# Patient Record
Sex: Female | Born: 1970 | Race: White | Hispanic: No | Marital: Married | Smoking: Heavy tobacco smoker
Health system: Southern US, Community
[De-identification: ages and names within clinical notes are randomized; demographics above are authoritative.]

## PROBLEM LIST (undated history)

## (undated) DIAGNOSIS — E785 Hyperlipidemia, unspecified: Secondary | ICD-10-CM

## (undated) DIAGNOSIS — R51 Headache: Secondary | ICD-10-CM

## (undated) DIAGNOSIS — F419 Anxiety disorder, unspecified: Secondary | ICD-10-CM

## (undated) DIAGNOSIS — E063 Autoimmune thyroiditis: Secondary | ICD-10-CM

## (undated) DIAGNOSIS — J40 Bronchitis, not specified as acute or chronic: Secondary | ICD-10-CM

## (undated) DIAGNOSIS — T7840XA Allergy, unspecified, initial encounter: Secondary | ICD-10-CM

## (undated) DIAGNOSIS — G4730 Sleep apnea, unspecified: Secondary | ICD-10-CM

## (undated) DIAGNOSIS — M1990 Unspecified osteoarthritis, unspecified site: Secondary | ICD-10-CM

## (undated) DIAGNOSIS — R519 Headache, unspecified: Secondary | ICD-10-CM

## (undated) DIAGNOSIS — E079 Disorder of thyroid, unspecified: Secondary | ICD-10-CM

## (undated) DIAGNOSIS — K219 Gastro-esophageal reflux disease without esophagitis: Secondary | ICD-10-CM

## (undated) HISTORY — DX: Headache: R51

## (undated) HISTORY — DX: Allergy, unspecified, initial encounter: T78.40XA

## (undated) HISTORY — DX: Autoimmune thyroiditis: E06.3

## (undated) HISTORY — PX: TONSILLECTOMY: SUR1361

## (undated) HISTORY — PX: ROTATOR CUFF REPAIR: SHX139

## (undated) HISTORY — DX: Anxiety disorder, unspecified: F41.9

## (undated) HISTORY — PX: KNEE ARTHROSCOPY: SHX127

## (undated) HISTORY — DX: Unspecified osteoarthritis, unspecified site: M19.90

## (undated) HISTORY — DX: Hyperlipidemia, unspecified: E78.5

## (undated) HISTORY — PX: ABDOMINAL HYSTERECTOMY: SHX81

## (undated) HISTORY — PX: CHOLECYSTECTOMY: SHX55

## (undated) HISTORY — DX: Bronchitis, not specified as acute or chronic: J40

## (undated) HISTORY — PX: TUBAL LIGATION: SHX77

## (undated) HISTORY — DX: Gastro-esophageal reflux disease without esophagitis: K21.9

## (undated) HISTORY — DX: Disorder of thyroid, unspecified: E07.9

## (undated) HISTORY — DX: Sleep apnea, unspecified: G47.30

## (undated) HISTORY — DX: Headache, unspecified: R51.9

---

## 2005-02-08 ENCOUNTER — Ambulatory Visit: Payer: Self-pay | Admitting: Family Medicine

## 2005-07-31 ENCOUNTER — Ambulatory Visit: Payer: Self-pay | Admitting: Family Medicine

## 2005-08-20 ENCOUNTER — Ambulatory Visit: Payer: Self-pay | Admitting: Orthopaedic Surgery

## 2005-11-27 ENCOUNTER — Ambulatory Visit: Payer: Self-pay | Admitting: Otolaryngology

## 2005-12-07 ENCOUNTER — Ambulatory Visit: Payer: Self-pay | Admitting: Otolaryngology

## 2005-12-17 ENCOUNTER — Ambulatory Visit: Payer: Self-pay | Admitting: Otolaryngology

## 2008-05-18 ENCOUNTER — Ambulatory Visit: Payer: Self-pay | Admitting: Gastroenterology

## 2008-07-09 ENCOUNTER — Ambulatory Visit: Payer: Self-pay | Admitting: Gastroenterology

## 2008-08-06 ENCOUNTER — Ambulatory Visit: Payer: Self-pay | Admitting: Obstetrics and Gynecology

## 2008-08-09 ENCOUNTER — Ambulatory Visit: Payer: Self-pay | Admitting: Obstetrics and Gynecology

## 2008-10-07 ENCOUNTER — Ambulatory Visit: Payer: Self-pay | Admitting: Surgery

## 2009-05-10 ENCOUNTER — Ambulatory Visit: Payer: Self-pay | Admitting: Internal Medicine

## 2009-08-16 ENCOUNTER — Ambulatory Visit: Payer: Self-pay | Admitting: Internal Medicine

## 2009-09-13 ENCOUNTER — Ambulatory Visit: Payer: Self-pay | Admitting: Family Medicine

## 2009-11-04 ENCOUNTER — Ambulatory Visit: Payer: Self-pay | Admitting: Unknown Physician Specialty

## 2011-03-05 ENCOUNTER — Ambulatory Visit: Payer: Self-pay

## 2011-10-22 ENCOUNTER — Ambulatory Visit: Payer: Self-pay | Admitting: Family Medicine

## 2012-08-22 ENCOUNTER — Ambulatory Visit: Payer: Self-pay | Admitting: Family Medicine

## 2012-11-04 ENCOUNTER — Ambulatory Visit: Payer: Self-pay | Admitting: Family Medicine

## 2013-04-16 DIAGNOSIS — M069 Rheumatoid arthritis, unspecified: Secondary | ICD-10-CM | POA: Diagnosis not present

## 2013-05-25 DIAGNOSIS — L819 Disorder of pigmentation, unspecified: Secondary | ICD-10-CM | POA: Diagnosis not present

## 2013-05-25 DIAGNOSIS — L908 Other atrophic disorders of skin: Secondary | ICD-10-CM | POA: Diagnosis not present

## 2013-05-25 DIAGNOSIS — L918 Other hypertrophic disorders of the skin: Secondary | ICD-10-CM | POA: Diagnosis not present

## 2013-05-25 DIAGNOSIS — L719 Rosacea, unspecified: Secondary | ICD-10-CM | POA: Diagnosis not present

## 2013-05-25 DIAGNOSIS — D2330 Other benign neoplasm of skin of unspecified part of face: Secondary | ICD-10-CM | POA: Diagnosis not present

## 2013-07-08 DIAGNOSIS — K5900 Constipation, unspecified: Secondary | ICD-10-CM | POA: Diagnosis not present

## 2013-07-08 DIAGNOSIS — K219 Gastro-esophageal reflux disease without esophagitis: Secondary | ICD-10-CM | POA: Diagnosis not present

## 2013-07-08 DIAGNOSIS — E039 Hypothyroidism, unspecified: Secondary | ICD-10-CM | POA: Diagnosis not present

## 2013-07-08 DIAGNOSIS — J3089 Other allergic rhinitis: Secondary | ICD-10-CM | POA: Diagnosis not present

## 2014-02-24 ENCOUNTER — Ambulatory Visit: Payer: Self-pay | Admitting: Physician Assistant

## 2014-02-24 DIAGNOSIS — K219 Gastro-esophageal reflux disease without esophagitis: Secondary | ICD-10-CM | POA: Diagnosis not present

## 2014-02-24 DIAGNOSIS — E039 Hypothyroidism, unspecified: Secondary | ICD-10-CM | POA: Diagnosis not present

## 2014-02-24 DIAGNOSIS — F17210 Nicotine dependence, cigarettes, uncomplicated: Secondary | ICD-10-CM | POA: Diagnosis not present

## 2014-02-24 DIAGNOSIS — H6002 Abscess of left external ear: Secondary | ICD-10-CM | POA: Diagnosis not present

## 2014-03-03 LAB — WOUND CULTURE

## 2014-04-26 DIAGNOSIS — M25511 Pain in right shoulder: Secondary | ICD-10-CM | POA: Diagnosis not present

## 2014-04-26 DIAGNOSIS — M25512 Pain in left shoulder: Secondary | ICD-10-CM | POA: Diagnosis not present

## 2014-04-26 DIAGNOSIS — M0579 Rheumatoid arthritis with rheumatoid factor of multiple sites without organ or systems involvement: Secondary | ICD-10-CM | POA: Diagnosis not present

## 2014-05-20 DIAGNOSIS — E039 Hypothyroidism, unspecified: Secondary | ICD-10-CM | POA: Diagnosis not present

## 2014-05-20 DIAGNOSIS — R194 Change in bowel habit: Secondary | ICD-10-CM | POA: Diagnosis not present

## 2014-07-29 DIAGNOSIS — K5909 Other constipation: Secondary | ICD-10-CM | POA: Diagnosis not present

## 2014-07-29 DIAGNOSIS — K625 Hemorrhage of anus and rectum: Secondary | ICD-10-CM | POA: Diagnosis not present

## 2014-08-20 ENCOUNTER — Encounter: Payer: Self-pay | Admitting: *Deleted

## 2014-08-20 ENCOUNTER — Ambulatory Visit: Payer: BLUE CROSS/BLUE SHIELD | Admitting: Anesthesiology

## 2014-08-20 ENCOUNTER — Encounter
Admission: RE | Disposition: A | Discharge status: Home / Self Care | Payer: Self-pay | Source: Ambulatory Visit | Attending: Gastroenterology

## 2014-08-20 ENCOUNTER — Ambulatory Visit
Admission: RE | Admit: 2014-08-20 | Discharge: 2014-08-20 | Disposition: A | Discharge status: Home / Self Care | Payer: BLUE CROSS/BLUE SHIELD | Source: Ambulatory Visit | Attending: Gastroenterology | Admitting: Gastroenterology

## 2014-08-20 DIAGNOSIS — Z9889 Other specified postprocedural states: Secondary | ICD-10-CM | POA: Diagnosis not present

## 2014-08-20 DIAGNOSIS — Z9049 Acquired absence of other specified parts of digestive tract: Secondary | ICD-10-CM | POA: Insufficient documentation

## 2014-08-20 DIAGNOSIS — Z791 Long term (current) use of non-steroidal anti-inflammatories (NSAID): Secondary | ICD-10-CM | POA: Insufficient documentation

## 2014-08-20 DIAGNOSIS — K640 First degree hemorrhoids: Secondary | ICD-10-CM | POA: Diagnosis not present

## 2014-08-20 DIAGNOSIS — K648 Other hemorrhoids: Secondary | ICD-10-CM | POA: Diagnosis not present

## 2014-08-20 DIAGNOSIS — K625 Hemorrhage of anus and rectum: Secondary | ICD-10-CM | POA: Diagnosis not present

## 2014-08-20 DIAGNOSIS — Z79899 Other long term (current) drug therapy: Secondary | ICD-10-CM | POA: Insufficient documentation

## 2014-08-20 HISTORY — PX: COLONOSCOPY: SHX5424

## 2014-08-20 SURGERY — COLONOSCOPY
Anesthesia: General

## 2014-08-20 MED ORDER — SODIUM CHLORIDE 0.9 % IV SOLN
INTRAVENOUS | Status: DC
  Administered 2014-08-20: 1000 mL via INTRAVENOUS

## 2014-08-20 MED ORDER — LIDOCAINE HCL (CARDIAC) 20 MG/ML IV SOLN
INTRAVENOUS | Status: DC | PRN
  Administered 2014-08-20: 60 mg via INTRAVENOUS

## 2014-08-20 MED ORDER — MIDAZOLAM HCL 2 MG/2ML IJ SOLN
INTRAMUSCULAR | Status: DC | PRN
  Administered 2014-08-20: 1 mg via INTRAVENOUS

## 2014-08-20 MED ORDER — PROPOFOL INFUSION 10 MG/ML OPTIME
INTRAVENOUS | Status: DC | PRN
  Administered 2014-08-20: 100 ug/kg/min via INTRAVENOUS

## 2014-08-20 MED ORDER — FENTANYL CITRATE (PF) 100 MCG/2ML IJ SOLN
INTRAMUSCULAR | Status: DC | PRN
  Administered 2014-08-20: 50 ug via INTRAVENOUS

## 2014-08-20 NOTE — H&P (Signed)
  Primary Care Physician:  [place] Pink, MD  Pre-Procedure History & Physical: HPI:  Lisa Wilkins is a 44 y.o. female is here for an colonoscopy.   No past medical history on file.  Past Surgical History  Procedure Laterality Date  . Cholecystectomy    . Tonsillectomy    . Knee arthroscopy Left     Prior to Admission medications   Medication Sig Start Date End Date Taking? Authorizing Provider  acetaminophen (TYLENOL) 650 MG CR tablet Take 1,300 mg by mouth every 8 (eight) hours as needed for pain.   Yes Historical Provider, MD  celecoxib (CELEBREX) 200 MG capsule Take 200 mg by mouth daily as needed for moderate pain.   Yes Historical Provider, MD  esomeprazole (NEXIUM) 20 MG capsule Take 40 mg by mouth daily at 12 noon.   Yes Historical Provider, MD  etanercept (ENBREL) 50 MG/ML injection Inject 50 mg into the skin once a week. On Saturday   Yes Historical Provider, MD  leflunomide (ARAVA) 20 MG tablet Take 20 mg by mouth daily.   Yes Historical Provider, MD  levothyroxine (SYNTHROID, LEVOTHROID) 150 MCG tablet Take 150 mcg by mouth daily before breakfast.   Yes Historical Provider, MD    Allergies as of 08/06/2014  . (Not on File)    No family history on file.  History   Social History  . Marital Status: Married    Spouse Name: N/A  . Number of Children: N/A  . Years of Education: N/A   Occupational History  . Not on file.   Social History Main Topics  . Smoking status: Not on file  . Smokeless tobacco: Not on file  . Alcohol Use: Not on file  . Drug Use: Not on file  . Sexual Activity: Not on file   Other Topics Concern  . Not on file   Social History Narrative  . No narrative on file     Physical Exam: BP 107/77 mmHg  Pulse 65  Temp(Src) 98.2 F (36.8 C) (Tympanic)  Resp 18  Ht 5\' 2"  (1.575 m)  Wt 153 lb (69.4 kg)  BMI 27.98 kg/m2  SpO2 99% General:   Alert,  pleasant and cooperative in NAD Head:  Normocephalic and atraumatic. Neck:   Supple; no masses or thyromegaly. Lungs:  Clear throughout to auscultation.    Heart:  Regular rate and rhythm. Abdomen:  Soft, nontender and nondistended. Normal bowel sounds, without guarding, and without rebound.   Neurologic:  Alert and  oriented x4;  grossly normal neurologically.  Impression/Plan: Lisa Wilkins is here for an colonoscopy to be performed for rectal bleeding.   Risks, benefits, limitations, and alternatives regarding  colonoscopy have been reviewed with the patient.  Questions have been answered.  All parties agreeable.   Josefine Class, MD  08/20/2014, 12:37 PM

## 2014-08-20 NOTE — Op Note (Signed)
[facility] Patient Name: Lisa Wilkins Procedure Date: 08/20/2014 1:22 PM MRN: [id number] Account #: [id number] Date of Birth: 10/12/1970 Admit Type: Outpatient Age: 44 Room: [facility] ROOM 1 Gender: Female Note Status: Finalized Procedure:         Colonoscopy Indications:       Last colonoscopy: 2010, Rectal bleeding Patient Profile:   This is a 44 year old female. Providers:         Gerrit Heck. Rayann Heman, MD Referring MD:      Irven Easterly. Kary Kos, MD (Referring MD) Medicines:         Propofol per Anesthesia Complications:     No immediate complications. Procedure:         Pre-Anesthesia Assessment:                    - Prior to the procedure, a History and Physical was                     performed, and patient medications and allergies were                     reviewed. The patient is competent. The risks and benefits                     of the procedure and the sedation options and risks were                     discussed with the patient. All questions were answered                     and informed consent was obtained. Patient identification                     and proposed procedure were verified by the physician and                     the nurse in the pre-procedure area. Mental Status                     Examination: alert and oriented. Airway Examination:                     normal oropharyngeal airway and neck mobility. Respiratory                     Examination: clear to auscultation. CV Examination: RRR,                     no murmurs, no S3 or S4. Prophylactic Antibiotics: The                     patient does not require prophylactic antibiotics. Prior                     Anticoagulants: The patient has taken no previous                     anticoagulant or antiplatelet agents. ASA Grade                     Assessment: II - A patient with mild systemic disease.                     After reviewing the risks and benefits, the  patient was                    deemed in satisfactory condition to undergo the procedure.                     The anesthesia plan was to use monitored anesthesia care                     (MAC). Immediately prior to administration of medications,                     the patient was re-assessed for adequacy to receive                     sedatives. The heart rate, respiratory rate, oxygen                     saturations, blood pressure, adequacy of pulmonary                     ventilation, and response to care were monitored                     throughout the procedure. The physical status of the                     patient was re-assessed after the procedure.                    - Prior to the procedure, a History and Physical was                     performed, and patient medications, allergies and                     sensitivities were reviewed. The patient's tolerance of                     previous anesthesia was reviewed.                    After obtaining informed consent, the colonoscope was                     passed under direct vision. Throughout the procedure, the                     patient's blood pressure, pulse, and oxygen saturations                     were monitored continuously. The Colonoscope was                     introduced through the anus and advanced to the the                     terminal ileum. The colonoscopy was performed without                     difficulty. The patient tolerated the procedure well. The                     quality of the bowel preparation was good. Findings:      The perianal and digital rectal examinations were normal.      Internal hemorrhoids were found during retroflexion. The hemorrhoids  were Grade I (internal hemorrhoids that do not prolapse).      The terminal ileum appeared normal.      The exam was otherwise without abnormality. Impression:        - Internal hemorrhoids.                    - The examined portion of the ileum was normal.                     - The examination was otherwise normal.                    - No specimens collected.                    - Likely source of bleeding is from small int hemorhoids. Recommendation:    - Observe patient in GI recovery unit.                    - High fiber diet.                    - Continue present medications.                    - Repeat colonoscopy in 10 years for screening purposes.                    - Return to referring physician.                    - The findings and recommendations were discussed with the                     patient.                    - The findings and recommendations were discussed with the                     patient's family. Procedure Code(s): --- Professional ---                    [phone], Colonoscopy, flexible; diagnostic, including                     collection of specimen(s) by brushing or washing, when                     performed (separate procedure) CPT copyright 2014 American [employer]. All rights reserved. The codes documented in this report are preliminary and upon coder review may  be revised to meet current compliance requirements. Mellody Life, MD 08/20/2014 2:03:57 PM This report has been signed electronically. Number of Addenda: 0 Note Initiated On: 08/20/2014 1:22 PM Scope Withdrawal Time: 0 hours 8 minutes 41 seconds  Total Procedure Duration: 0 hours 11 minutes 48 seconds       [facility]

## 2014-08-20 NOTE — Anesthesia Preprocedure Evaluation (Signed)
Anesthesia Evaluation  Patient identified by MRN, date of birth, ID band Patient awake    Reviewed: Allergy & Precautions, H&P , NPO status , Patient's Chart, lab work & pertinent test results, reviewed documented beta blocker date and time   Airway Mallampati: II  TM Distance: >3 FB Neck ROM: full    Dental no notable dental hx.    Pulmonary neg pulmonary ROS, asthma ,  breath sounds clear to auscultation  Pulmonary exam normal       Cardiovascular Exercise Tolerance: Good negative cardio ROS  Rhythm:regular Rate:Normal     Neuro/Psych negative neurological ROS  negative psych ROS   GI/Hepatic negative GI ROS, Neg liver ROS,   Endo/Other  negative endocrine ROS  Renal/GU negative Renal ROS  negative genitourinary   Musculoskeletal   Abdominal   Peds  Hematology negative hematology ROS (+)   Anesthesia Other Findings   Reproductive/Obstetrics negative OB ROS                             Anesthesia Physical Anesthesia Plan  ASA: II  Anesthesia Plan: General   Post-op Pain Management:    Induction:   Airway Management Planned:   Additional Equipment:   Intra-op Plan:   Post-operative Plan:   Informed Consent: I have reviewed the patients History and Physical, chart, labs and discussed the procedure including the risks, benefits and alternatives for the proposed anesthesia with the patient or authorized representative who has indicated his/her understanding and acceptance.   Dental Advisory Given  Plan Discussed with: CRNA  Anesthesia Plan Comments:         Anesthesia Quick Evaluation

## 2014-08-20 NOTE — Transfer of Care (Signed)
Immediate Anesthesia Transfer of Care Note  Patient: Lisa Wilkins  Procedure(s) Performed: Procedure(s): COLONOSCOPY (N/A)  Patient Location: PACU  Anesthesia Type:General  Level of Consciousness: awake, alert  and oriented  Airway & Oxygen Therapy: Patient Spontanous Breathing and Patient connected to nasal cannula oxygen  Post-op Assessment: Report given to RN and Post -op Vital signs reviewed and stable  Post vital signs: Reviewed and stable  Last Vitals:  Filed Vitals:   08/20/14 1405  BP: 93/62  Pulse: 77  Temp: 36 C  Resp: 17    Complications: No apparent anesthesia complications

## 2014-08-21 NOTE — Anesthesia Postprocedure Evaluation (Deleted)
  Anesthesia Post-op Note  Patient: Lisa Wilkins  Procedure(s) Performed: Procedure(s): COLONOSCOPY (N/A)  Anesthesia type:General  Patient location: PACU  Post pain: Pain level controlled  Post assessment: Post-op Vital signs reviewed, Patient's Cardiovascular Status Stable, Respiratory Function Stable, Patent Airway and No signs of Nausea or vomiting  Post vital signs: Reviewed and stable  Last Vitals:  Filed Vitals:   08/20/14 1430  BP: 107/67  Pulse: 55  Temp:   Resp: 14    Level of consciousness: awake, alert  and patient cooperative  Complications: No apparent anesthesia complications

## 2014-08-23 NOTE — Anesthesia Postprocedure Evaluation (Signed)
  Anesthesia Post-op Note  Patient: Lisa Wilkins  Procedure(s) Performed: Procedure(s): COLONOSCOPY (N/A)  Anesthesia type:General  Patient location: PACU  Post pain: Pain level controlled  Post assessment: Post-op Vital signs reviewed, Patient's Cardiovascular Status Stable, Respiratory Function Stable, Patent Airway and No signs of Nausea or vomiting  Post vital signs: Reviewed and stable  Last Vitals:  Filed Vitals:   08/20/14 1430  BP: 107/67  Pulse: 55  Temp:   Resp: 14    Level of consciousness: awake, alert  and patient cooperative  Complications: No apparent anesthesia complications

## 2014-08-24 ENCOUNTER — Encounter: Payer: Self-pay | Admitting: Gastroenterology

## 2014-09-09 ENCOUNTER — Encounter: Payer: Self-pay | Admitting: Physical Therapy

## 2014-09-09 ENCOUNTER — Ambulatory Visit: Payer: BLUE CROSS/BLUE SHIELD | Attending: Gastroenterology | Admitting: Physical Therapy

## 2014-09-09 DIAGNOSIS — R279 Unspecified lack of coordination: Secondary | ICD-10-CM | POA: Diagnosis not present

## 2014-09-09 DIAGNOSIS — K5900 Constipation, unspecified: Secondary | ICD-10-CM | POA: Diagnosis not present

## 2014-09-09 DIAGNOSIS — M629 Disorder of muscle, unspecified: Secondary | ICD-10-CM
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
## 2014-09-09 NOTE — Patient Instructions (Addendum)
      2) try coffee alternative 50% and coffee 50% to slowly lean off of coffee. [employer] has ingredients for increased fiber 3) info on nutritionist at [facility]          4)                                        Preserve the function of your pelvic floor, abdomen, and back.              Avoid decreased straining of abdominal/pelvic floor muscles with less              slouching,  holding your breath with lifting/bowel movements)                                                     FUNCTIONAL POSTURES

## 2014-09-15 ENCOUNTER — Ambulatory Visit: Payer: BLUE CROSS/BLUE SHIELD | Admitting: Physical Therapy

## 2014-09-15 DIAGNOSIS — M629 Disorder of muscle, unspecified: Secondary | ICD-10-CM

## 2014-09-15 DIAGNOSIS — R279 Unspecified lack of coordination: Secondary | ICD-10-CM

## 2014-09-15 DIAGNOSIS — K5900 Constipation, unspecified: Secondary | ICD-10-CM | POA: Diagnosis not present

## 2014-09-15 NOTE — Patient Instructions (Addendum)
PELVIC FLOOR / KEGEL EXERCISES   Pelvic floor/ Kegel exercises are used to strengthen the muscles in the base of your pelvis that are responsible for supporting your pelvic organs and preventing urine/feces leakage. Based on your therapist's recommendations, they can be performed while standing, sitting, or lying down. Imagine pelvic floor area as a diamond with pelvic landmarks: top =pubic bone, bottom tip=tailbone, sides=sitting bones (ischial tuberosities).    Make yourself aware of this muscle group by using these cues while coordinating your breath:  Inhale, feel pelvic floor diamond area lower like hammock towards your feet and ribcage/belly expanding. Pause. Let the exhale naturally and feel your belly sink, abdominal muscles hugging in around you and you may notice the pelvic diamond draws upward towards your head forming a umbrella shape. Give a squeeze during the exhalation like you are stopping the flow of urine. If you are squeezing the buttock muscles, try to give 50% less effort.   Common Errors:  Breath holding: If you are holding your breath, you may be bearing down against your bladder instead of pulling it up. If you belly bulges up while you are squeezing, you are holding your breath. Be sure to breathe gently in and out while exercising. Counting out loud may help you avoid holding your breath.  Accessory muscle use: You should not see or feel other muscle movement when performing pelvic floor exercises. When done properly, no one can tell that you are performing the exercises. Keep the buttocks, belly and inner thighs relaxed.  Overdoing it: Your muscles can fatigue and stop working for you if you over-exercise. You may actually leak more or feel soreness at the lower abdomen or rectum.  YOUR HOME EXERCISE PROGRAM  LONG HOLDS: Position: on back with pillow under back   Inhale and then exhale. Then squeeze the muscle and count aloud for 10 seconds. Rest with three long  breaths. (Be sure to let belly sink in with exhales and not push outward)  Perform 6 repetitions, 3 times/day                      DECREASE DOWNWARD PRESSURE ON  YOUR PELVIC FLOOR, ABDOMINAL, LOW BACK MUSCLES       PRESERVE YOUR PELVIC HEALTH LONG-TERM   ** SQUEEZE pelvic floor BEFORE YOUR SNEEZE, COUGH, LAUGH   ** EXHALE BEFORE YOU RISE AGAINST GRAVITY (lifting, sit to stand, from squat to stand)   ** LOG ROLL OUT OF BED INSTEAD OF CRUNCH/SIT-UP     2) Dietary recommendations:  Add one more bottle of water in per day with bottle of water with crystal light  (flavor water with mint, lemon, strawberry)      Drink one cup of water and have non sugary yogurt before coffee    3) belly massage (handout)
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
## 2014-09-20 ENCOUNTER — Encounter: Payer: Medicare Other | Admitting: Physical Therapy

## 2014-09-27 ENCOUNTER — Encounter: Payer: Medicare Other | Admitting: Physical Therapy

## 2014-10-06 ENCOUNTER — Encounter: Payer: Medicare Other | Admitting: Physical Therapy

## 2015-01-12 DIAGNOSIS — M25512 Pain in left shoulder: Secondary | ICD-10-CM | POA: Diagnosis not present

## 2015-01-12 DIAGNOSIS — M0579 Rheumatoid arthritis with rheumatoid factor of multiple sites without organ or systems involvement: Secondary | ICD-10-CM | POA: Diagnosis not present

## 2015-01-12 DIAGNOSIS — E039 Hypothyroidism, unspecified: Secondary | ICD-10-CM | POA: Diagnosis not present

## 2015-01-12 DIAGNOSIS — G8929 Other chronic pain: Secondary | ICD-10-CM | POA: Diagnosis not present

## 2015-01-14 ENCOUNTER — Other Ambulatory Visit: Payer: Self-pay | Admitting: Rheumatology

## 2015-01-14 DIAGNOSIS — G8929 Other chronic pain: Secondary | ICD-10-CM

## 2015-01-14 DIAGNOSIS — M25512 Pain in left shoulder: Principal | ICD-10-CM

## 2015-01-24 ENCOUNTER — Ambulatory Visit
Admission: RE | Admit: 2015-01-24 | Discharge: 2015-01-24 | Disposition: A | Discharge status: Home / Self Care | Payer: BLUE CROSS/BLUE SHIELD | Source: Ambulatory Visit | Attending: Rheumatology | Admitting: Rheumatology

## 2015-01-24 DIAGNOSIS — G8929 Other chronic pain: Secondary | ICD-10-CM | POA: Diagnosis present

## 2015-01-24 DIAGNOSIS — M67912 Unspecified disorder of synovium and tendon, left shoulder: Secondary | ICD-10-CM | POA: Diagnosis not present

## 2015-01-24 DIAGNOSIS — R531 Weakness: Secondary | ICD-10-CM | POA: Diagnosis present

## 2015-01-24 DIAGNOSIS — M25512 Pain in left shoulder: Secondary | ICD-10-CM

## 2015-01-24 DIAGNOSIS — M75112 Incomplete rotator cuff tear or rupture of left shoulder, not specified as traumatic: Secondary | ICD-10-CM | POA: Insufficient documentation

## 2015-03-17 DIAGNOSIS — M75102 Unspecified rotator cuff tear or rupture of left shoulder, not specified as traumatic: Secondary | ICD-10-CM | POA: Diagnosis not present

## 2015-03-17 DIAGNOSIS — S46212A Strain of muscle, fascia and tendon of other parts of biceps, left arm, initial encounter: Secondary | ICD-10-CM | POA: Diagnosis not present

## 2015-03-17 DIAGNOSIS — M069 Rheumatoid arthritis, unspecified: Secondary | ICD-10-CM | POA: Diagnosis not present

## 2015-04-05 DIAGNOSIS — M25612 Stiffness of left shoulder, not elsewhere classified: Secondary | ICD-10-CM | POA: Diagnosis not present

## 2015-04-05 DIAGNOSIS — M25512 Pain in left shoulder: Secondary | ICD-10-CM | POA: Diagnosis not present

## 2015-04-08 DIAGNOSIS — M25612 Stiffness of left shoulder, not elsewhere classified: Secondary | ICD-10-CM | POA: Diagnosis not present

## 2015-04-08 DIAGNOSIS — M25512 Pain in left shoulder: Secondary | ICD-10-CM | POA: Diagnosis not present

## 2015-04-11 DIAGNOSIS — M25612 Stiffness of left shoulder, not elsewhere classified: Secondary | ICD-10-CM | POA: Diagnosis not present

## 2015-04-11 DIAGNOSIS — M25512 Pain in left shoulder: Secondary | ICD-10-CM | POA: Diagnosis not present

## 2015-04-14 DIAGNOSIS — M25512 Pain in left shoulder: Secondary | ICD-10-CM | POA: Diagnosis not present

## 2015-04-14 DIAGNOSIS — M25612 Stiffness of left shoulder, not elsewhere classified: Secondary | ICD-10-CM | POA: Diagnosis not present

## 2015-04-18 DIAGNOSIS — M25612 Stiffness of left shoulder, not elsewhere classified: Secondary | ICD-10-CM | POA: Diagnosis not present

## 2015-04-18 DIAGNOSIS — M25512 Pain in left shoulder: Secondary | ICD-10-CM | POA: Diagnosis not present

## 2015-04-20 DIAGNOSIS — M25612 Stiffness of left shoulder, not elsewhere classified: Secondary | ICD-10-CM | POA: Diagnosis not present

## 2015-04-20 DIAGNOSIS — M25512 Pain in left shoulder: Secondary | ICD-10-CM | POA: Diagnosis not present

## 2015-04-27 DIAGNOSIS — M25512 Pain in left shoulder: Secondary | ICD-10-CM | POA: Diagnosis not present

## 2015-04-27 DIAGNOSIS — M25612 Stiffness of left shoulder, not elsewhere classified: Secondary | ICD-10-CM | POA: Diagnosis not present

## 2015-05-04 DIAGNOSIS — M25512 Pain in left shoulder: Secondary | ICD-10-CM | POA: Diagnosis not present

## 2015-05-04 DIAGNOSIS — M25612 Stiffness of left shoulder, not elsewhere classified: Secondary | ICD-10-CM | POA: Diagnosis not present

## 2015-05-13 DIAGNOSIS — M25612 Stiffness of left shoulder, not elsewhere classified: Secondary | ICD-10-CM | POA: Diagnosis not present

## 2015-05-13 DIAGNOSIS — M25512 Pain in left shoulder: Secondary | ICD-10-CM | POA: Diagnosis not present

## 2015-05-16 DIAGNOSIS — M25612 Stiffness of left shoulder, not elsewhere classified: Secondary | ICD-10-CM | POA: Diagnosis not present

## 2015-05-16 DIAGNOSIS — M25512 Pain in left shoulder: Secondary | ICD-10-CM | POA: Diagnosis not present

## 2015-05-24 DIAGNOSIS — M25612 Stiffness of left shoulder, not elsewhere classified: Secondary | ICD-10-CM | POA: Diagnosis not present

## 2015-05-24 DIAGNOSIS — M25512 Pain in left shoulder: Secondary | ICD-10-CM | POA: Diagnosis not present

## 2015-05-30 DIAGNOSIS — M25612 Stiffness of left shoulder, not elsewhere classified: Secondary | ICD-10-CM | POA: Diagnosis not present

## 2015-05-30 DIAGNOSIS — M25512 Pain in left shoulder: Secondary | ICD-10-CM | POA: Diagnosis not present

## 2015-06-02 DIAGNOSIS — R0981 Nasal congestion: Secondary | ICD-10-CM | POA: Diagnosis not present

## 2015-06-02 DIAGNOSIS — J301 Allergic rhinitis due to pollen: Secondary | ICD-10-CM | POA: Diagnosis not present

## 2015-06-09 DIAGNOSIS — M25612 Stiffness of left shoulder, not elsewhere classified: Secondary | ICD-10-CM | POA: Diagnosis not present

## 2015-06-09 DIAGNOSIS — M25512 Pain in left shoulder: Secondary | ICD-10-CM | POA: Diagnosis not present

## 2015-06-14 DIAGNOSIS — J301 Allergic rhinitis due to pollen: Secondary | ICD-10-CM | POA: Diagnosis not present

## 2015-06-14 DIAGNOSIS — M25512 Pain in left shoulder: Secondary | ICD-10-CM | POA: Diagnosis not present

## 2015-06-14 DIAGNOSIS — M25612 Stiffness of left shoulder, not elsewhere classified: Secondary | ICD-10-CM | POA: Diagnosis not present

## 2015-06-20 DIAGNOSIS — M75112 Incomplete rotator cuff tear or rupture of left shoulder, not specified as traumatic: Secondary | ICD-10-CM | POA: Diagnosis not present

## 2015-06-20 DIAGNOSIS — M7522 Bicipital tendinitis, left shoulder: Secondary | ICD-10-CM | POA: Diagnosis not present

## 2015-06-20 DIAGNOSIS — M06812 Other specified rheumatoid arthritis, left shoulder: Secondary | ICD-10-CM | POA: Diagnosis not present

## 2015-06-24 DIAGNOSIS — M25612 Stiffness of left shoulder, not elsewhere classified: Secondary | ICD-10-CM | POA: Diagnosis not present

## 2015-06-24 DIAGNOSIS — M25512 Pain in left shoulder: Secondary | ICD-10-CM | POA: Diagnosis not present

## 2015-07-01 DIAGNOSIS — M25512 Pain in left shoulder: Secondary | ICD-10-CM | POA: Diagnosis not present

## 2015-07-01 DIAGNOSIS — M25612 Stiffness of left shoulder, not elsewhere classified: Secondary | ICD-10-CM | POA: Diagnosis not present

## 2015-07-11 DIAGNOSIS — M25512 Pain in left shoulder: Secondary | ICD-10-CM | POA: Diagnosis not present

## 2015-07-11 DIAGNOSIS — M25612 Stiffness of left shoulder, not elsewhere classified: Secondary | ICD-10-CM | POA: Diagnosis not present

## 2015-07-13 DIAGNOSIS — Z79899 Other long term (current) drug therapy: Secondary | ICD-10-CM | POA: Diagnosis not present

## 2015-07-13 DIAGNOSIS — G8929 Other chronic pain: Secondary | ICD-10-CM | POA: Diagnosis not present

## 2015-07-13 DIAGNOSIS — M0579 Rheumatoid arthritis with rheumatoid factor of multiple sites without organ or systems involvement: Secondary | ICD-10-CM | POA: Diagnosis not present

## 2015-07-13 DIAGNOSIS — M25512 Pain in left shoulder: Secondary | ICD-10-CM | POA: Diagnosis not present

## 2015-07-14 DIAGNOSIS — M25512 Pain in left shoulder: Secondary | ICD-10-CM | POA: Diagnosis not present

## 2015-07-14 DIAGNOSIS — M25612 Stiffness of left shoulder, not elsewhere classified: Secondary | ICD-10-CM | POA: Diagnosis not present

## 2015-08-01 DIAGNOSIS — M25612 Stiffness of left shoulder, not elsewhere classified: Secondary | ICD-10-CM | POA: Diagnosis not present

## 2015-08-01 DIAGNOSIS — M25512 Pain in left shoulder: Secondary | ICD-10-CM | POA: Diagnosis not present

## 2015-08-03 DIAGNOSIS — M7522 Bicipital tendinitis, left shoulder: Secondary | ICD-10-CM | POA: Diagnosis not present

## 2015-08-03 DIAGNOSIS — M75112 Incomplete rotator cuff tear or rupture of left shoulder, not specified as traumatic: Secondary | ICD-10-CM | POA: Diagnosis not present

## 2015-08-03 DIAGNOSIS — M7122 Synovial cyst of popliteal space [Baker], left knee: Secondary | ICD-10-CM | POA: Diagnosis not present

## 2015-08-03 DIAGNOSIS — M06812 Other specified rheumatoid arthritis, left shoulder: Secondary | ICD-10-CM | POA: Diagnosis not present

## 2015-11-08 DIAGNOSIS — M25562 Pain in left knee: Secondary | ICD-10-CM | POA: Diagnosis not present

## 2015-11-08 DIAGNOSIS — M0579 Rheumatoid arthritis with rheumatoid factor of multiple sites without organ or systems involvement: Secondary | ICD-10-CM | POA: Diagnosis not present

## 2015-12-27 DIAGNOSIS — M0579 Rheumatoid arthritis with rheumatoid factor of multiple sites without organ or systems involvement: Secondary | ICD-10-CM | POA: Diagnosis not present

## 2015-12-27 DIAGNOSIS — G8929 Other chronic pain: Secondary | ICD-10-CM | POA: Diagnosis not present

## 2015-12-27 DIAGNOSIS — M25562 Pain in left knee: Secondary | ICD-10-CM | POA: Diagnosis not present

## 2016-01-03 DIAGNOSIS — M1712 Unilateral primary osteoarthritis, left knee: Secondary | ICD-10-CM | POA: Diagnosis not present

## 2016-01-03 DIAGNOSIS — S83262A Peripheral tear of lateral meniscus, current injury, left knee, initial encounter: Secondary | ICD-10-CM | POA: Diagnosis not present

## 2016-01-04 DIAGNOSIS — Z79899 Other long term (current) drug therapy: Secondary | ICD-10-CM | POA: Diagnosis not present

## 2016-01-04 DIAGNOSIS — E785 Hyperlipidemia, unspecified: Secondary | ICD-10-CM | POA: Diagnosis not present

## 2016-01-04 DIAGNOSIS — E039 Hypothyroidism, unspecified: Secondary | ICD-10-CM | POA: Diagnosis not present

## 2016-01-04 DIAGNOSIS — Z720 Tobacco use: Secondary | ICD-10-CM | POA: Diagnosis not present

## 2016-01-04 DIAGNOSIS — K219 Gastro-esophageal reflux disease without esophagitis: Secondary | ICD-10-CM | POA: Diagnosis not present

## 2016-01-04 DIAGNOSIS — M0579 Rheumatoid arthritis with rheumatoid factor of multiple sites without organ or systems involvement: Secondary | ICD-10-CM | POA: Diagnosis not present

## 2016-01-06 ENCOUNTER — Other Ambulatory Visit: Payer: Self-pay | Admitting: Family Medicine

## 2016-01-06 DIAGNOSIS — Z1231 Encounter for screening mammogram for malignant neoplasm of breast: Secondary | ICD-10-CM

## 2016-01-09 ENCOUNTER — Other Ambulatory Visit: Payer: Self-pay | Admitting: Orthopaedic Surgery

## 2016-01-09 DIAGNOSIS — M25562 Pain in left knee: Secondary | ICD-10-CM

## 2016-01-20 ENCOUNTER — Ambulatory Visit
Admission: RE | Admit: 2016-01-20 | Discharge: 2016-01-20 | Disposition: A | Discharge status: Home / Self Care | Payer: BLUE CROSS/BLUE SHIELD | Source: Ambulatory Visit | Attending: Orthopaedic Surgery | Admitting: Orthopaedic Surgery

## 2016-01-20 DIAGNOSIS — M7122 Synovial cyst of popliteal space [Baker], left knee: Secondary | ICD-10-CM | POA: Insufficient documentation

## 2016-01-20 DIAGNOSIS — M659 Synovitis and tenosynovitis, unspecified: Secondary | ICD-10-CM | POA: Insufficient documentation

## 2016-01-20 DIAGNOSIS — M25562 Pain in left knee: Secondary | ICD-10-CM | POA: Diagnosis not present

## 2016-01-20 DIAGNOSIS — X58XXXA Exposure to other specified factors, initial encounter: Secondary | ICD-10-CM | POA: Diagnosis not present

## 2016-01-20 DIAGNOSIS — S83204A Other tear of unspecified meniscus, current injury, left knee, initial encounter: Secondary | ICD-10-CM | POA: Insufficient documentation

## 2016-01-20 DIAGNOSIS — M25462 Effusion, left knee: Secondary | ICD-10-CM | POA: Diagnosis not present

## 2016-01-20 DIAGNOSIS — M1712 Unilateral primary osteoarthritis, left knee: Secondary | ICD-10-CM | POA: Insufficient documentation

## 2016-01-25 DIAGNOSIS — S83262A Peripheral tear of lateral meniscus, current injury, left knee, initial encounter: Secondary | ICD-10-CM | POA: Diagnosis not present

## 2016-01-25 DIAGNOSIS — M1712 Unilateral primary osteoarthritis, left knee: Secondary | ICD-10-CM | POA: Diagnosis not present

## 2016-02-06 DIAGNOSIS — M1712 Unilateral primary osteoarthritis, left knee: Secondary | ICD-10-CM | POA: Diagnosis not present

## 2016-02-06 DIAGNOSIS — S83262A Peripheral tear of lateral meniscus, current injury, left knee, initial encounter: Secondary | ICD-10-CM | POA: Diagnosis not present

## 2016-02-10 ENCOUNTER — Ambulatory Visit: Payer: Medicare Other

## 2016-02-14 DIAGNOSIS — M659 Synovitis and tenosynovitis, unspecified: Secondary | ICD-10-CM | POA: Diagnosis not present

## 2016-02-14 DIAGNOSIS — M23301 Other meniscus derangements, unspecified lateral meniscus, left knee: Secondary | ICD-10-CM | POA: Diagnosis not present

## 2016-02-14 DIAGNOSIS — M94262 Chondromalacia, left knee: Secondary | ICD-10-CM | POA: Diagnosis not present

## 2016-02-14 DIAGNOSIS — M65862 Other synovitis and tenosynovitis, left lower leg: Secondary | ICD-10-CM | POA: Diagnosis not present

## 2016-02-14 DIAGNOSIS — M1712 Unilateral primary osteoarthritis, left knee: Secondary | ICD-10-CM | POA: Diagnosis not present

## 2016-02-14 DIAGNOSIS — M23362 Other meniscus derangements, other lateral meniscus, left knee: Secondary | ICD-10-CM | POA: Diagnosis not present

## 2016-02-14 DIAGNOSIS — M23322 Other meniscus derangements, posterior horn of medial meniscus, left knee: Secondary | ICD-10-CM | POA: Diagnosis not present

## 2016-02-14 DIAGNOSIS — M7122 Synovial cyst of popliteal space [Baker], left knee: Secondary | ICD-10-CM | POA: Diagnosis not present

## 2016-02-14 DIAGNOSIS — M6752 Plica syndrome, left knee: Secondary | ICD-10-CM | POA: Diagnosis not present

## 2016-02-17 DIAGNOSIS — M25562 Pain in left knee: Secondary | ICD-10-CM | POA: Diagnosis not present

## 2016-02-17 DIAGNOSIS — M25662 Stiffness of left knee, not elsewhere classified: Secondary | ICD-10-CM | POA: Diagnosis not present

## 2016-02-20 DIAGNOSIS — M25562 Pain in left knee: Secondary | ICD-10-CM | POA: Diagnosis not present

## 2016-02-20 DIAGNOSIS — M25662 Stiffness of left knee, not elsewhere classified: Secondary | ICD-10-CM | POA: Diagnosis not present

## 2016-02-24 DIAGNOSIS — M25562 Pain in left knee: Secondary | ICD-10-CM | POA: Diagnosis not present

## 2016-02-24 DIAGNOSIS — M25662 Stiffness of left knee, not elsewhere classified: Secondary | ICD-10-CM | POA: Diagnosis not present

## 2016-02-29 DIAGNOSIS — M25662 Stiffness of left knee, not elsewhere classified: Secondary | ICD-10-CM | POA: Diagnosis not present

## 2016-02-29 DIAGNOSIS — M25562 Pain in left knee: Secondary | ICD-10-CM | POA: Diagnosis not present

## 2016-03-02 DIAGNOSIS — M25662 Stiffness of left knee, not elsewhere classified: Secondary | ICD-10-CM | POA: Diagnosis not present

## 2016-03-02 DIAGNOSIS — M25562 Pain in left knee: Secondary | ICD-10-CM | POA: Diagnosis not present

## 2016-03-06 DIAGNOSIS — M25562 Pain in left knee: Secondary | ICD-10-CM | POA: Diagnosis not present

## 2016-03-06 DIAGNOSIS — M25662 Stiffness of left knee, not elsewhere classified: Secondary | ICD-10-CM | POA: Diagnosis not present

## 2016-03-08 DIAGNOSIS — M25662 Stiffness of left knee, not elsewhere classified: Secondary | ICD-10-CM | POA: Diagnosis not present

## 2016-03-08 DIAGNOSIS — M25562 Pain in left knee: Secondary | ICD-10-CM | POA: Diagnosis not present

## 2016-03-15 DIAGNOSIS — M25562 Pain in left knee: Secondary | ICD-10-CM | POA: Diagnosis not present

## 2016-03-15 DIAGNOSIS — M25662 Stiffness of left knee, not elsewhere classified: Secondary | ICD-10-CM | POA: Diagnosis not present

## 2016-03-20 DIAGNOSIS — M25662 Stiffness of left knee, not elsewhere classified: Secondary | ICD-10-CM | POA: Diagnosis not present

## 2016-03-20 DIAGNOSIS — M25562 Pain in left knee: Secondary | ICD-10-CM | POA: Diagnosis not present

## 2016-07-04 DIAGNOSIS — Z79899 Other long term (current) drug therapy: Secondary | ICD-10-CM | POA: Diagnosis not present

## 2016-07-04 DIAGNOSIS — J45909 Unspecified asthma, uncomplicated: Secondary | ICD-10-CM | POA: Diagnosis not present

## 2016-07-04 DIAGNOSIS — M0579 Rheumatoid arthritis with rheumatoid factor of multiple sites without organ or systems involvement: Secondary | ICD-10-CM | POA: Diagnosis not present

## 2016-07-09 ENCOUNTER — Ambulatory Visit
Admission: EM | Admit: 2016-07-09 | Discharge: 2016-07-09 | Disposition: A | Discharge status: Home / Self Care | Payer: BLUE CROSS/BLUE SHIELD | Attending: Family Medicine | Admitting: Family Medicine

## 2016-07-09 DIAGNOSIS — J0100 Acute maxillary sinusitis, unspecified: Secondary | ICD-10-CM | POA: Diagnosis not present

## 2016-07-09 MED ORDER — ALBUTEROL SULFATE HFA 108 (90 BASE) MCG/ACT IN AERS: 1.0000 | INHALATION_SPRAY | Freq: Four times a day (QID) | RESPIRATORY_TRACT | 0 refills | Status: AC | PRN

## 2016-07-09 MED ORDER — AMOXICILLIN 875 MG PO TABS: 875.0000 mg | ORAL_TABLET | Freq: Two times a day (BID) | ORAL | 0 refills | Status: DC

## 2016-07-09 NOTE — ED Provider Notes (Signed)
[facility]    CSN: [id number] Arrival date & time: 07/09/16  5631     History   Chief Complaint Chief Complaint  Patient presents with  . Cough    HPI Lisa Wilkins is a 46 y.o. female.   The history is provided by the patient.  Cough  Associated symptoms: wheezing   Associated symptoms: no headaches   URI  Presenting symptoms: congestion and cough   Severity:  Moderate Onset quality:  Sudden Timing:  Constant Chronicity:  New Relieved by:  Nothing Worsened by:  Nothing Ineffective treatments:  None tried Associated symptoms: sinus pain and wheezing   Associated symptoms: no headaches   Risk factors: not elderly, no chronic cardiac disease, no chronic kidney disease, no chronic respiratory disease, no diabetes mellitus, no immunosuppression, no recent illness, no recent travel and no sick contacts     Past Medical History:  Diagnosis Date  . Allergy   . Arthritis    Rheumatoid   . Bronchitis   . Generalized headaches   . GERD (gastroesophageal reflux disease)   . Hyperlipidemia   . Thyroid disease    Graves     There are no active problems to display for this patient.   Past Surgical History:  Procedure Laterality Date  . ABDOMINAL HYSTERECTOMY     partial for alleviating arthritis  . CHOLECYSTECTOMY    . COLONOSCOPY N/A 08/20/2014   Procedure: COLONOSCOPY;  Surgeon: Josefine Class, MD;  Location: [facility];  Service: Endoscopy;  Laterality: N/A;  . KNEE ARTHROSCOPY Left   . TONSILLECTOMY    . TUBAL LIGATION      OB History    No data available       Home Medications    Prior to Admission medications   Medication Sig Start Date End Date Taking? Authorizing Provider  acetaminophen (TYLENOL) 650 MG CR tablet Take 1,300 mg by mouth every 8 (eight) hours as needed for pain.   Yes Historical Provider, MD  celecoxib (CELEBREX) 200 MG capsule Take 200 mg by mouth daily as needed for moderate pain.   Yes Historical  Provider, MD  esomeprazole (NEXIUM) 20 MG capsule Take 40 mg by mouth daily at 12 noon.   Yes Historical Provider, MD  etanercept (ENBREL) 50 MG/ML injection Inject 50 mg into the skin once a week. On Saturday   Yes Historical Provider, MD  leflunomide (ARAVA) 20 MG tablet Take 20 mg by mouth daily.   Yes Historical Provider, MD  levothyroxine (SYNTHROID, LEVOTHROID) 150 MCG tablet Take 150 mcg by mouth daily before breakfast.   Yes Historical Provider, MD  montelukast (SINGULAIR) 10 MG tablet Take 10 mg by mouth at bedtime.   Yes Historical Provider, MD  albuterol (PROVENTIL HFA;VENTOLIN HFA) 108 (90 Base) MCG/ACT inhaler Inhale 1-2 puffs into the lungs every 6 (six) hours as needed for wheezing or shortness of breath. 07/09/16   Norval Gable, MD  amoxicillin (AMOXIL) 875 MG tablet Take 1 tablet (875 mg total) by mouth 2 (two) times daily. 07/09/16   Norval Gable, MD  senna (SENOKOT) 8.6 MG tablet Take 1 tablet by mouth daily.    Historical Provider, MD    Family History History reviewed. No pertinent family history.  Social History Social History  Substance Use Topics  . Smoking status: Heavy Tobacco Smoker    Packs/day: 1.00    Years: 20.00  . Smokeless tobacco: Never Used  . Alcohol use No     Allergies  Remicade [infliximab] and Sulfur   Review of Systems Review of Systems  HENT: Positive for congestion and sinus pain.   Respiratory: Positive for cough and wheezing.   Neurological: Negative for headaches.     Physical Exam Triage Vital Signs ED Triage Vitals  Enc Vitals Group     BP 07/09/16 1048 134/78     Pulse Rate 07/09/16 1048 72     Resp 07/09/16 1048 18     Temp 07/09/16 1048 98.4 F (36.9 C)     Temp Source 07/09/16 1048 Oral     SpO2 07/09/16 1048 96 %     Weight 07/09/16 1046 161 lb (73 kg)     Height 07/09/16 1046 5\' 2"  (1.575 m)     Head Circumference --      Peak Flow --      Pain Score 07/09/16 1046 2     Pain Loc --      Pain Edu? --       Excl. in [place]? --    No data found.   Updated Vital Signs BP 134/78 (BP Location: Left Arm)   Pulse 72   Temp 98.4 F (36.9 C) (Oral)   Resp 18   Ht 5\' 2"  (1.575 m)   Wt 161 lb (73 kg)   SpO2 96%   BMI 29.45 kg/m   Visual Acuity Right Eye Distance:   Left Eye Distance:   Bilateral Distance:    Right Eye Near:   Left Eye Near:    Bilateral Near:     Physical Exam  Constitutional: She appears well-developed and well-nourished. No distress.  HENT:  Head: Normocephalic and atraumatic.  Right Ear: Tympanic membrane, external ear and ear canal normal.  Left Ear: Tympanic membrane, external ear and ear canal normal.  Nose: Mucosal edema and rhinorrhea present. No nose lacerations, sinus tenderness, nasal deformity, septal deviation or nasal septal hematoma. No epistaxis.  No foreign bodies. Right sinus exhibits maxillary sinus tenderness and frontal sinus tenderness. Left sinus exhibits maxillary sinus tenderness and frontal sinus tenderness.  Mouth/Throat: Uvula is midline, oropharynx is clear and moist and mucous membranes are normal. No oropharyngeal exudate.  Eyes: Conjunctivae and EOM are normal. Pupils are equal, round, and reactive to light. Right eye exhibits no discharge. Left eye exhibits no discharge. No scleral icterus.  Neck: Normal range of motion. Neck supple. No thyromegaly present.  Cardiovascular: Normal rate, regular rhythm and normal heart sounds.   Pulmonary/Chest: Effort normal and breath sounds normal. No respiratory distress. She has no wheezes. She has no rales.  Lymphadenopathy:    She has no cervical adenopathy.  Skin: She is not diaphoretic.  Nursing note and vitals reviewed.    UC Treatments / Results  Labs (all labs ordered are listed, but only abnormal results are displayed) Labs Reviewed - No data to display  EKG  EKG Interpretation None       Radiology No results found.  Procedures Procedures (including critical care  time)  Medications Ordered in UC Medications - No data to display   Initial Impression / Assessment and Plan / UC Course  I have reviewed the triage vital signs and the nursing notes.  Pertinent labs & imaging results that were available during my care of the patient were reviewed by me and considered in my medical decision making (see chart for details).       Final Clinical Impressions(s) / UC Diagnoses   Final diagnoses:  Acute maxillary sinusitis, recurrence not  specified    New Prescriptions Discharge Medication List as of 07/09/2016 11:10 AM    START taking these medications   Details  albuterol (PROVENTIL HFA;VENTOLIN HFA) 108 (90 Base) MCG/ACT inhaler Inhale 1-2 puffs into the lungs every 6 (six) hours as needed for wheezing or shortness of breath., Starting Mon 07/09/2016, Normal    amoxicillin (AMOXIL) 875 MG tablet Take 1 tablet (875 mg total) by mouth 2 (two) times daily., Starting Mon 07/09/2016, Normal       1. diagnosis reviewed with patient 2. rx as per orders above; reviewed possible side effects, interactions, risks and benefits  3. Follow-up prn if symptoms worsen or don't improve   Norval Gable, MD 07/09/16 1329

## 2016-07-09 NOTE — ED Triage Notes (Signed)
Patient complains of cough and congestion x 10 days. Patient states that she has also had sinus pain and pressure. Patient states that she thought she was improving and has since worsened again.

## 2016-09-17 IMAGING — MR MR SHOULDER*L* W/O CM
5 series · 40 of 40 positions shown · non-contrast
Comparison: None.

CLINICAL DATA: Shoulder pain with weakness and decreased range of
motion for 6 months, worsening over the last 2 months. No known
injury or prior relevant surgery. Initial encounter.

EXAM:
MRI OF THE LEFT SHOULDER WITHOUT CONTRAST
TECHNIQUE: Multiplanar, multisequence MR imaging of the shoulder was performed.
No intravenous contrast was administered.

[Series 3: T2 fat-sat · axial · 4.0mm · 0.47mm/px · z∈[-30,+58]mm · 8 of 21 slices shown (1 of 3)]
[im 1/21]
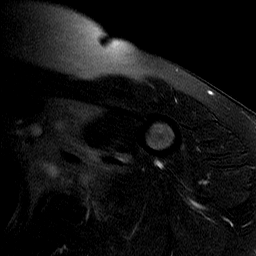
[im 3/21]
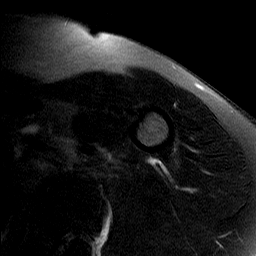
[im 6/21]
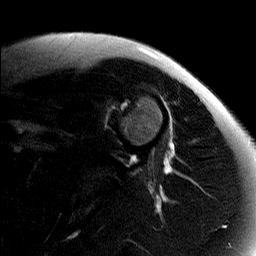
[im 9/21]
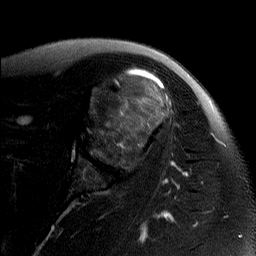
[im 12/21]
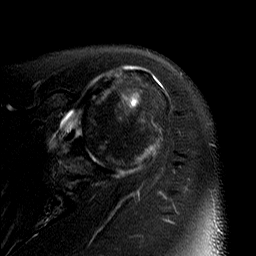
[im 15/21]
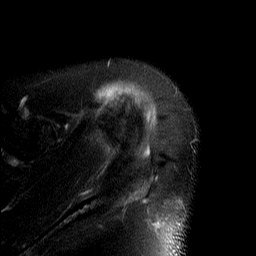
[im 18/21]
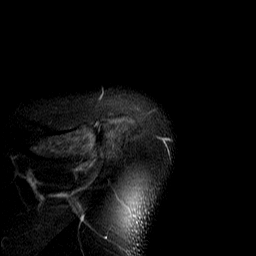
[im 21/21]
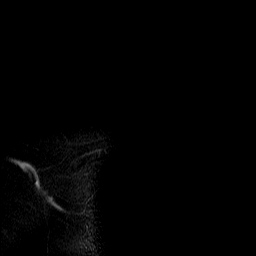

[Series 4: T2 fat-sat · oblique · 4.0mm · 0.62mm/px · 8 of 19 slices shown (2 of 3)]
[im 1/19]
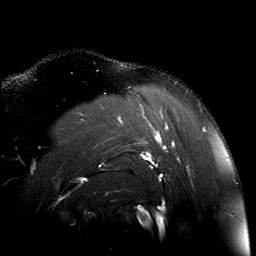
[im 3/19]
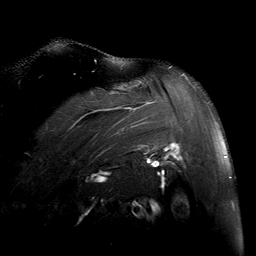
[im 6/19]
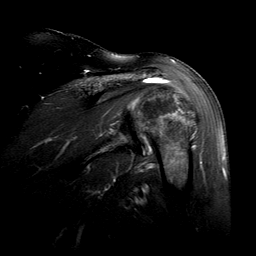
[im 8/19]
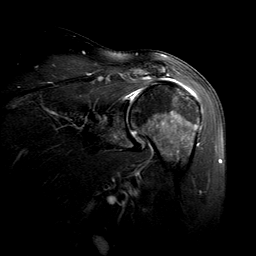
[im 11/19]
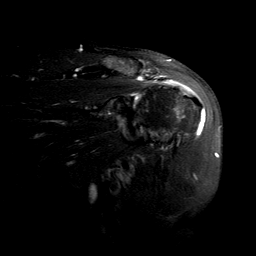
[im 13/19]
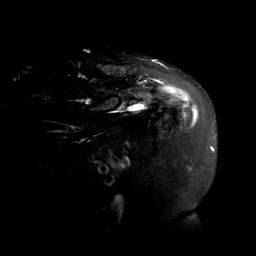
[im 16/19]
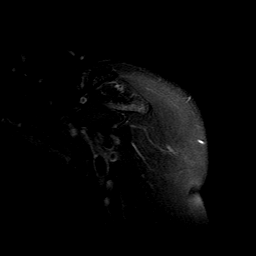
[im 19/19]
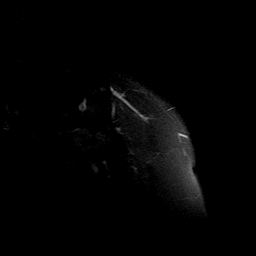

[Series 5: PD · oblique · 4.0mm · 0.62mm/px · 8 of 19 slices shown]
[im 1/19]
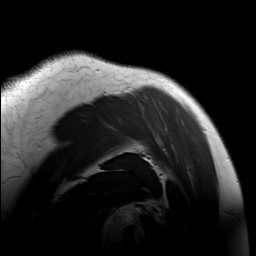
[im 3/19]
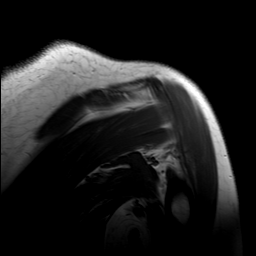
[im 6/19]
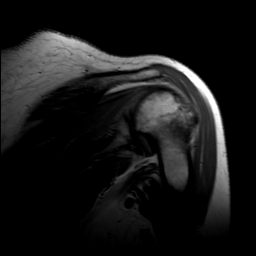
[im 8/19]
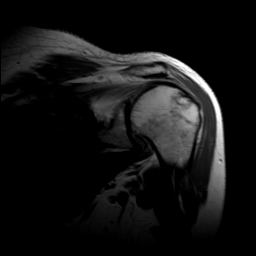
[im 11/19]
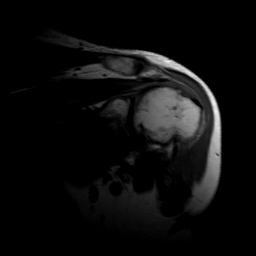
[im 13/19]
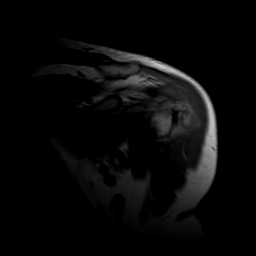
[im 16/19]
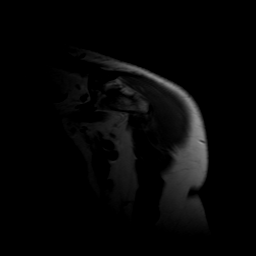
[im 19/19]
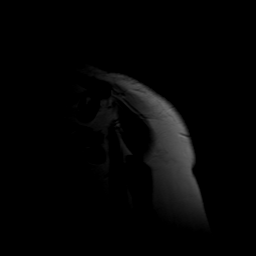

[Series 6: T1 · oblique · 4.0mm · 0.62mm/px · 8 of 19 slices shown]
[im 1/19]
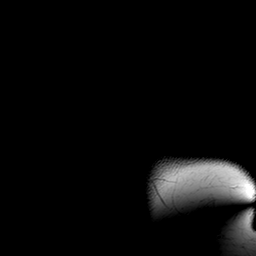
[im 3/19]
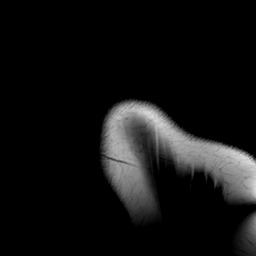
[im 6/19]
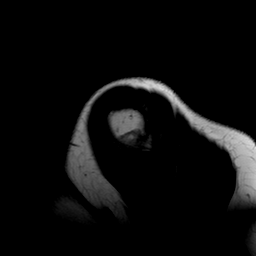
[im 8/19]
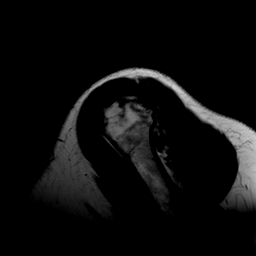
[im 11/19]
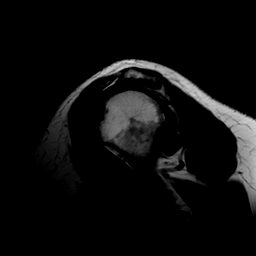
[im 13/19]
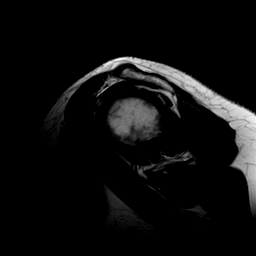
[im 16/19]
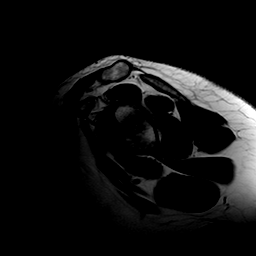
[im 19/19]
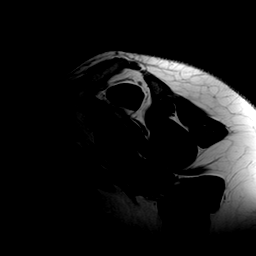

[Series 7: T2 fat-sat · oblique · 4.0mm · 0.62mm/px · 8 of 19 slices shown (3 of 3)]
[im 1/19]
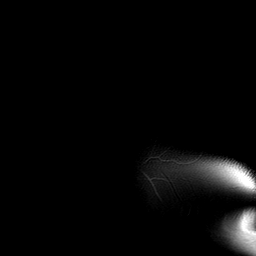
[im 3/19]
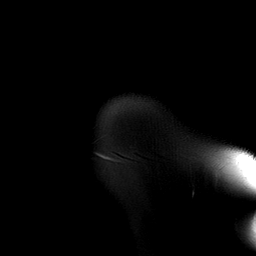
[im 6/19]
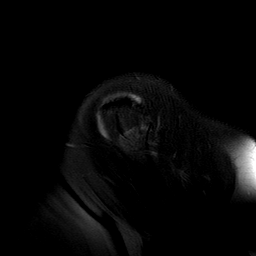
[im 8/19]
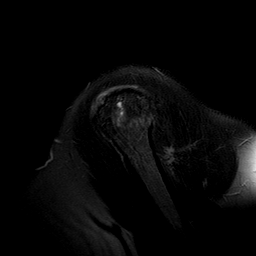
[im 11/19]
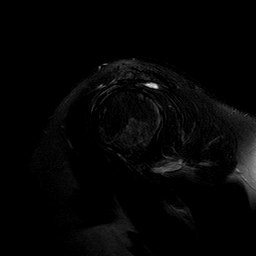
[im 13/19]
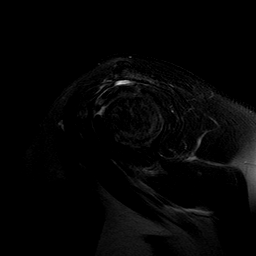
[im 16/19]
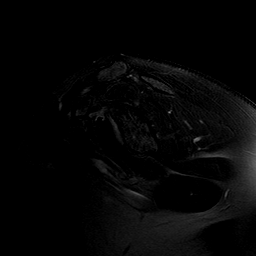
[im 19/19]
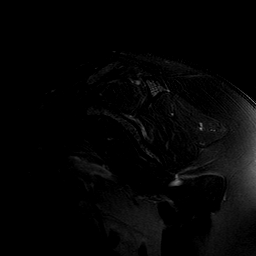

[40 of 40 positions shown; findings below may reference images not displayed]

FINDINGS: Rotator cuff: The supraspinatus tendon is attenuated with at least
high-grade partial tearing anteriorly. A small full thickness
insertional tear is likely based on the sagittal images. There is no
significant tendon retraction. The subscapularis, infraspinatus and
teres minor tendons are intact.

Muscles:  No focal muscular atrophy or edema.

Biceps long head: The intra-articular portion of the biceps tendon
is attenuated without definite discontinuity. The tendon is normally
positioned distally within the bicipital groove.

Acromioclavicular Joint: The acromion is type 2. There are mild
acromioclavicular degenerative changes. There is a small to moderate
amount of fluid in the subacromial -subdeltoid bursa.

Glenohumeral Joint: Mild glenohumeral degenerative changes. No
significant joint effusion or joint capsular thickening.

Labrum: There is fraying of the superior labrum on the coronal
images. No discrete labral tear or paralabral cyst.

Bones:  No significant extra-articular osseous findings.
IMPRESSION: 1. Supraspinatus tendinosis with at least high-grade partial
articular surface insertional tearing anteriorly. No significant
tendon retraction or muscular atrophy.
2. Superior labral fraying with attenuation of the intra-articular
portion of the biceps tendon.

## 2016-10-25 ENCOUNTER — Other Ambulatory Visit: Payer: Self-pay | Admitting: Family Medicine

## 2016-10-25 DIAGNOSIS — R519 Headache, unspecified: Secondary | ICD-10-CM

## 2016-10-25 DIAGNOSIS — I729 Aneurysm of unspecified site: Secondary | ICD-10-CM

## 2016-10-25 DIAGNOSIS — R51 Headache: Principal | ICD-10-CM

## 2016-11-01 ENCOUNTER — Ambulatory Visit: Payer: BLUE CROSS/BLUE SHIELD

## 2017-02-15 ENCOUNTER — Other Ambulatory Visit: Payer: Self-pay | Admitting: Family Medicine

## 2017-02-15 DIAGNOSIS — Z1231 Encounter for screening mammogram for malignant neoplasm of breast: Secondary | ICD-10-CM

## 2017-02-28 ENCOUNTER — Other Ambulatory Visit: Payer: Self-pay | Admitting: Student

## 2017-02-28 DIAGNOSIS — R1084 Generalized abdominal pain: Secondary | ICD-10-CM

## 2017-03-05 ENCOUNTER — Ambulatory Visit
Admission: RE | Admit: 2017-03-05 | Discharge: 2017-03-05 | Disposition: A | Discharge status: Home / Self Care | Payer: BLUE CROSS/BLUE SHIELD | Source: Ambulatory Visit | Attending: Student | Admitting: Student

## 2017-03-05 DIAGNOSIS — K219 Gastro-esophageal reflux disease without esophagitis: Secondary | ICD-10-CM | POA: Diagnosis not present

## 2017-03-05 DIAGNOSIS — R1084 Generalized abdominal pain: Secondary | ICD-10-CM | POA: Diagnosis present

## 2017-03-06 ENCOUNTER — Ambulatory Visit
Admission: EM | Admit: 2017-03-06 | Discharge: 2017-03-06 | Disposition: A | Discharge status: Home / Self Care | Payer: BLUE CROSS/BLUE SHIELD | Attending: Family Medicine | Admitting: Family Medicine

## 2017-03-06 ENCOUNTER — Other Ambulatory Visit: Payer: Self-pay

## 2017-03-06 ENCOUNTER — Encounter: Payer: Self-pay | Admitting: *Deleted

## 2017-03-06 DIAGNOSIS — R059 Cough, unspecified: Secondary | ICD-10-CM

## 2017-03-06 DIAGNOSIS — R05 Cough: Secondary | ICD-10-CM | POA: Diagnosis not present

## 2017-03-06 DIAGNOSIS — J069 Acute upper respiratory infection, unspecified: Secondary | ICD-10-CM

## 2017-03-06 MED ORDER — BENZONATATE 100 MG PO CAPS: 100.0000 mg | ORAL_CAPSULE | Freq: Three times a day (TID) | ORAL | 0 refills | Status: DC | PRN

## 2017-03-06 MED ORDER — ALBUTEROL SULFATE HFA 108 (90 BASE) MCG/ACT IN AERS: 2.0000 | INHALATION_SPRAY | RESPIRATORY_TRACT | 0 refills | Status: DC | PRN

## 2017-03-06 MED ORDER — HYDROCOD POLST-CPM POLST ER 10-8 MG/5ML PO SUER: 5.0000 mL | Freq: Every evening | ORAL | 0 refills | Status: DC | PRN

## 2017-03-06 NOTE — ED Provider Notes (Signed)
[facility] ____________________________________________  Time seen: Approximately 1035 AM  I have reviewed the triage vital signs and the nursing notes.   HISTORY  Chief Complaint Cough and Nasal Congestion   HPI Lisa Wilkins is a 46 y.o. female presented for evaluation of 3 days of runny nose, nasal congestion, nasal drainage and cough.  States cough does intermittently wake her up at night.  States having a lot of nasal congestion.  States some sinus clogged sensation.  Denies fevers.  Reports her's husband sick with similar symptoms just prior to her getting sick.  States has occasionally heard herself wheeze with coughing, does report has a history of asthma and is a smoker.  Denies chest pain or shortness of breath.  States some intermittent thicker greenish nasal drainage in the morning, otherwise mostly clear.  Denies hemoptysis.  Reports some mild intermittent sore throat.  States some ear discomfort to the right ear intermittently, comes and goes.  States has tried a few over-the-counter cough and congestion medication with slight improvement, no resolution.  Reports otherwise feels well. Denies chest pain, shortness of breath, abdominal pain, or rash. Denies recent sickness. Denies recent antibiotic use.   [place] Pink, MD: PCP No LMP recorded. Patient has had a hysterectomy.   Past Medical History:  Diagnosis Date  . Allergy   . Arthritis    Rheumatoid   . Bronchitis   . Generalized headaches   . GERD (gastroesophageal reflux disease)   . Hyperlipidemia   . Thyroid disease    Graves     There are no active problems to display for this patient.   Past Surgical History:  Procedure Laterality Date  . ABDOMINAL HYSTERECTOMY     partial for alleviating arthritis  . CHOLECYSTECTOMY    . COLONOSCOPY N/A 08/20/2014   Procedure: COLONOSCOPY;  Surgeon: Josefine Class, MD;  Location: [facility] ENDOSCOPY;  Service: Endoscopy;  Laterality: N/A;  .  KNEE ARTHROSCOPY Left   . TONSILLECTOMY    . TUBAL LIGATION       No current facility-administered medications for this encounter.   Current Outpatient Medications:  .  acetaminophen (TYLENOL) 650 MG CR tablet, Take 1,300 mg by mouth every 8 (eight) hours as needed for pain., Disp: , Rfl:  .  albuterol (PROVENTIL HFA;VENTOLIN HFA) 108 (90 Base) MCG/ACT inhaler, Inhale 1-2 puffs into the lungs every 6 (six) hours as needed for wheezing or shortness of breath., Disp: 1 Inhaler, Rfl: 0 .  celecoxib (CELEBREX) 200 MG capsule, Take 200 mg by mouth daily as needed for moderate pain., Disp: , Rfl:  .  esomeprazole (NEXIUM) 20 MG capsule, Take 40 mg by mouth daily at 12 noon., Disp: , Rfl:  .  etanercept (ENBREL) 50 MG/ML injection, Inject 50 mg into the skin once a week. On Saturday, Disp: , Rfl:  .  leflunomide (ARAVA) 20 MG tablet, Take 20 mg by mouth daily., Disp: , Rfl:  .  levothyroxine (SYNTHROID, LEVOTHROID) 175 MCG tablet, Take 175 mcg by mouth daily before breakfast., Disp: , Rfl:  .  albuterol (PROVENTIL HFA;VENTOLIN HFA) 108 (90 Base) MCG/ACT inhaler, Inhale 2 puffs into the lungs every 4 (four) hours as needed for wheezing., Disp: 1 Inhaler, Rfl: 0 .  benzonatate (TESSALON PERLES) 100 MG capsule, Take 1 capsule (100 mg total) by mouth 3 (three) times daily as needed for cough., Disp: 15 capsule, Rfl: 0 .  chlorpheniramine-HYDROcodone (TUSSIONEX PENNKINETIC ER) 10-8 MG/5ML SUER, Take 5 mLs by mouth at bedtime as  needed for cough. do not drive or operate machinery while taking as can cause drowsiness., Disp: 75 mL, Rfl: 0 .  levothyroxine (SYNTHROID, LEVOTHROID) 150 MCG tablet, Take 150 mcg by mouth daily before breakfast., Disp: , Rfl:   Allergies Remicade [infliximab] and Sulfur  Family History  Problem Relation Age of Onset  . Rheum arthritis Mother   . Stroke Father     Social History Social History   Tobacco Use  . Smoking status: Heavy Tobacco Smoker    Packs/day: 1.00     Years: 20.00    Pack years: 20.00    Types: Cigarettes  . Smokeless tobacco: Never Used  Substance Use Topics  . Alcohol use: No  . Drug use: No    Review of Systems Constitutional: No fever/chills ENT:As above.  Cardiovascular: Denies chest pain. Respiratory: Denies shortness of breath. Gastrointestinal: No abdominal pain.   Musculoskeletal: Negative for back pain. Skin: Negative for rash.   ____________________________________________   PHYSICAL EXAM:  VITAL SIGNS: ED Triage Vitals  Enc Vitals Group     BP 03/06/17 0956 133/82     Pulse Rate 03/06/17 0956 71     Resp 03/06/17 0956 18     Temp 03/06/17 0956 98.5 F (36.9 C)     Temp Source 03/06/17 0956 Oral     SpO2 03/06/17 0956 98 %     Weight 03/06/17 0957 170 lb (77.1 kg)     Height 03/06/17 0957 5\' 2"  (1.575 m)     Head Circumference --      Peak Flow --      Pain Score 03/06/17 0957 0     Pain Loc --      Pain Edu? --      Excl. in [place]? --     Constitutional: Alert and oriented. Well appearing and in no acute distress. Eyes: Conjunctivae are normal.  Head: Atraumatic. No sinus tenderness to palpation. No swelling. No erythema.  Ears: no erythema, normal TMs bilaterally.   Nose:Nasal congestion with clear rhinorrhea  Mouth/Throat: Mucous membranes are moist. No pharyngeal erythema. No tonsillar swelling or exudate.  Neck: No stridor.  No cervical spine tenderness to palpation. Hematological/Lymphatic/Immunilogical: No cervical lymphadenopathy. Cardiovascular: Normal rate, regular rhythm. Grossly normal heart sounds.  Good peripheral circulation. Respiratory: Normal respiratory effort.  No retractions. No wheezes, rales or rhonchi. Good air movement.  Occasional dry cough noted in room with minimal bronchospasm.  Speaks in complete sentences. Musculoskeletal: Ambulatory with steady gait. No cervical, thoracic or lumbar tenderness to palpation. Neurologic:  Normal speech and language. No gait  instability. Skin:  Skin appears warm, dry and intact. No rash noted. Psychiatric: Mood and affect are normal. Speech and behavior are normal.  ___________________________________________   LABS (all labs ordered are listed, but only abnormal results are displayed)  Labs Reviewed - No data to display ____________________________________________  PROCEDURES Procedures     INITIAL IMPRESSION / ASSESSMENT AND PLAN / ED COURSE  Pertinent labs & imaging results that were available during my care of the patient were reviewed by me and considered in my medical decision making (see chart for details).  Very well-appearing patient.  No acute distress.  Suspect viral upper respiratory infection.  Encouraged rest, fluids, supportive care, over-the-counter decongestants, as needed Tessalon Perles, as needed Tussionex and as needed albuterol inhaler.  Patient states that she think she has an albuterol inhaler at home but not positive, so requested new Rx.Discussed indication, risks and benefits of medications with patient.  Discussed follow up with Primary care physician this week. Discussed follow up and return parameters including no resolution or any worsening concerns. Patient verbalized understanding and agreed to plan.   ____________________________________________   FINAL CLINICAL IMPRESSION(S) / ED DIAGNOSES  Final diagnoses:  Upper respiratory tract infection, unspecified type  Cough     ED Discharge Orders        Ordered    benzonatate (TESSALON PERLES) 100 MG capsule  3 times daily PRN     03/06/17 1036    chlorpheniramine-HYDROcodone (TUSSIONEX PENNKINETIC ER) 10-8 MG/5ML SUER  At bedtime PRN     03/06/17 1036    albuterol (PROVENTIL HFA;VENTOLIN HFA) 108 (90 Base) MCG/ACT inhaler  Every 4 hours PRN     03/06/17 1036       Note: This dictation was prepared with Dragon dictation along with smaller phrase technology. Any transcriptional errors that result from this  process are unintentional.         Marylene Land, NP 03/06/17 1535

## 2017-03-06 NOTE — Discharge Instructions (Signed)
Take medication as prescribed. Rest. Drink plenty of fluids.  ° °Follow up with your primary care physician this week as needed. Return to Urgent care for new or worsening concerns.  ° °

## 2017-03-06 NOTE — ED Triage Notes (Signed)
Patient started having nasal congestion, drainage, and right ear pain 3 days ago.

## 2017-03-13 ENCOUNTER — Ambulatory Visit
Admission: RE | Admit: 2017-03-13 | Discharge: 2017-03-13 | Disposition: A | Discharge status: Home / Self Care | Payer: BLUE CROSS/BLUE SHIELD | Source: Ambulatory Visit | Attending: Family Medicine | Admitting: Family Medicine

## 2017-03-13 DIAGNOSIS — Z1231 Encounter for screening mammogram for malignant neoplasm of breast: Secondary | ICD-10-CM | POA: Diagnosis present

## 2018-01-26 ENCOUNTER — Ambulatory Visit
Admission: EM | Admit: 2018-01-26 | Discharge: 2018-01-26 | Disposition: A | Discharge status: Home / Self Care | Payer: BLUE CROSS/BLUE SHIELD | Attending: Family Medicine | Admitting: Family Medicine

## 2018-01-26 DIAGNOSIS — E0500 Thyrotoxicosis with diffuse goiter without thyrotoxic crisis or storm: Secondary | ICD-10-CM | POA: Insufficient documentation

## 2018-01-26 DIAGNOSIS — J069 Acute upper respiratory infection, unspecified: Secondary | ICD-10-CM

## 2018-01-26 DIAGNOSIS — H66001 Acute suppurative otitis media without spontaneous rupture of ear drum, right ear: Secondary | ICD-10-CM | POA: Diagnosis not present

## 2018-01-26 DIAGNOSIS — L920 Granuloma annulare: Secondary | ICD-10-CM | POA: Insufficient documentation

## 2018-01-26 DIAGNOSIS — M059 Rheumatoid arthritis with rheumatoid factor, unspecified: Secondary | ICD-10-CM | POA: Insufficient documentation

## 2018-01-26 DIAGNOSIS — G43909 Migraine, unspecified, not intractable, without status migrainosus: Secondary | ICD-10-CM | POA: Insufficient documentation

## 2018-01-26 DIAGNOSIS — E785 Hyperlipidemia, unspecified: Secondary | ICD-10-CM | POA: Insufficient documentation

## 2018-01-26 DIAGNOSIS — K219 Gastro-esophageal reflux disease without esophagitis: Secondary | ICD-10-CM | POA: Insufficient documentation

## 2018-01-26 MED ORDER — AMOXICILLIN-POT CLAVULANATE 875-125 MG PO TABS: 1.0000 | ORAL_TABLET | Freq: Two times a day (BID) | ORAL | 0 refills | Status: DC

## 2018-01-26 MED ORDER — HYDROCOD POLST-CPM POLST ER 10-8 MG/5ML PO SUER: 5.0000 mL | Freq: Two times a day (BID) | ORAL | 0 refills | Status: DC | PRN

## 2018-01-26 NOTE — Discharge Instructions (Signed)
Antibiotic as prescribed.  Rest, fluids.  Cough medication as needed.  Take care  Dr. Lacinda Axon

## 2018-01-26 NOTE — ED Triage Notes (Signed)
Pt here for nasal congestion since Friday. Also complains of cough and facial pain. Has been taking tylenol sinus and cold with a small amount of relief with the nasal congestion.

## 2018-01-26 NOTE — ED Provider Notes (Signed)
[facility]    CSN: [id number] Arrival date & time: 01/26/18  1937  History   Chief Complaint Chief Complaint  Patient presents with  . URI   HPI   47 year old female presents with upper respiratory symptoms.  Started on Friday.  Patient reports sore throat, cough, and severe sinus pressure and pain.  Patient states that she has a lot of trouble with her sinuses.  Patient is on immunosuppressive medications for rheumatoid arthritis.  No fever.  No chills.  She is used over-the-counter medication without improvement.  No known exacerbating factors.  Additionally, patient reports associated dental discomfort.  No other associated symptoms.  No other complaints.  PMH, Surgical Hx, Family Hx, Social History reviewed and updated as below.  Past Medical History:  Diagnosis Date  . Allergy   . Arthritis    Rheumatoid   . Bronchitis   . Generalized headaches   . GERD (gastroesophageal reflux disease)   . Hyperlipidemia   . Thyroid disease    Graves     Patient Active Problem List   Diagnosis Date Noted  . GERD (gastroesophageal reflux disease) 01/26/2018  . 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 Graves' disease 01/26/2018  . Hyperlipidemia 01/26/2018  . Migraines 01/26/2018  . Rheumatoid arthritis with rheumatoid factor ([place]) 01/26/2018    Past Surgical History:  Procedure Laterality Date  . ABDOMINAL HYSTERECTOMY     partial for alleviating arthritis  . CHOLECYSTECTOMY    . COLONOSCOPY N/A 08/20/2014   Procedure: COLONOSCOPY;  Surgeon: Josefine Class, MD;  Location: [facility] ENDOSCOPY;  Service: Endoscopy;  Laterality: N/A;  . KNEE ARTHROSCOPY Left   . TONSILLECTOMY    . TUBAL LIGATION      OB History   None      Home Medications    Prior to Admission medications   Medication Sig Start Date End Date Taking? Authorizing Provider  esomeprazole (NEXIUM) 20 MG capsule Take 40 mg by mouth daily at 12 noon.   Yes [provider]  etanercept  (ENBREL) 50 MG/ML injection Inject 50 mg into the skin once a week. On Saturday   Yes [provider]  leflunomide (ARAVA) 20 MG tablet Take 20 mg by mouth daily.   Yes [provider]  levothyroxine (SYNTHROID, LEVOTHROID) 175 MCG tablet Take 175 mcg by mouth daily before breakfast.   Yes [provider]  sulindac (CLINORIL) 200 MG tablet  01/09/18  Yes [provider]  acetaminophen (TYLENOL) 650 MG CR tablet Take 1,300 mg by mouth every 8 (eight) hours as needed for pain.    [provider]  albuterol (PROVENTIL HFA;VENTOLIN HFA) 108 (90 Base) MCG/ACT inhaler Inhale 1-2 puffs into the lungs every 6 (six) hours as needed for wheezing or shortness of breath. 07/09/16   Norval Gable, MD  albuterol (PROVENTIL HFA;VENTOLIN HFA) 108 (90 Base) MCG/ACT inhaler Inhale 2 puffs into the lungs every 4 (four) hours as needed for wheezing. 03/06/17   Marylene Land, NP  amoxicillin-clavulanate (AUGMENTIN) 875-125 MG tablet Take 1 tablet by mouth every 12 (twelve) hours. 01/26/18   Coral Spikes, DO  celecoxib (CELEBREX) 200 MG capsule Take 200 mg by mouth daily as needed for moderate pain.    [provider]  chlorpheniramine-HYDROcodone (TUSSIONEX PENNKINETIC ER) 10-8 MG/5ML SUER Take 5 mLs by mouth every 12 (twelve) hours as needed. 01/26/18   Coral Spikes, DO  levothyroxine (SYNTHROID, LEVOTHROID) 150 MCG tablet Take 150 mcg by mouth daily before breakfast.  [provider]    Family History Family History  Problem Relation Age of Onset  . Rheum arthritis Mother   . Stroke Father   . Breast cancer Neg Hx     Social History Social History   Tobacco Use  . Smoking status: Heavy Tobacco Smoker    Packs/day: 1.00    Years: 20.00    Pack years: 20.00    Types: Cigarettes  . Smokeless tobacco: Never Used  Substance Use Topics  . Alcohol use: No  . Drug use: No     Allergies   Remicade [infliximab] and Sulfur   Review of  Systems Review of Systems  Constitutional: Negative for fever.  HENT: Positive for congestion, sinus pressure, sinus pain and sore throat.   Respiratory: Positive for cough.    Physical Exam Triage Vital Signs ED Triage Vitals  Enc Vitals Group     BP 01/26/18 0837 (!) 149/91     Pulse Rate 01/26/18 0837 99     Resp 01/26/18 0837 18     Temp 01/26/18 0837 98 F (36.7 C)     Temp Source 01/26/18 0837 Oral     SpO2 01/26/18 0837 95 %     Weight 01/26/18 0839 180 lb (81.6 kg)     Height --      Head Circumference --      Peak Flow --      Pain Score 01/26/18 0839 0     Pain Loc --      Pain Edu? --      Excl. in [place]? --    Updated Vital Signs BP (!) 149/91 (BP Location: Left Arm)   Pulse 99   Temp 98 F (36.7 C) (Oral)   Resp 18   Wt 81.6 kg   SpO2 95%   BMI 32.92 kg/m   Visual Acuity Right Eye Distance:   Left Eye Distance:   Bilateral Distance:    Right Eye Near:   Left Eye Near:    Bilateral Near:     Physical Exam  Constitutional: She is oriented to person, place, and time. She appears well-developed.  Appears ill.  HENT:  Head: Normocephalic and atraumatic.  Maxillary sinus tenderness to palpation. Left TM normal.  Right TM with bulging, erythema, and purulent effusion.  Eyes: Conjunctivae are normal. Right eye exhibits no discharge. Left eye exhibits no discharge.  Cardiovascular: Normal rate and regular rhythm.  Pulmonary/Chest: Effort normal and breath sounds normal. She has no wheezes. She has no rales.  Neurological: She is alert and oriented to person, place, and time.  Psychiatric: Her behavior is normal.  Flat affect.  Nursing note and vitals reviewed.  UC Treatments / Results  Labs (all labs ordered are listed, but only abnormal results are displayed) Labs Reviewed - No data to display  EKG None  Radiology No results found.  Procedures Procedures (including critical care time)  Medications Ordered in UC Medications - No data to  display  Initial Impression / Assessment and Plan / UC Course  I have reviewed the triage vital signs and the nursing notes.  Pertinent labs & imaging results that were available during my care of the patient were reviewed by me and considered in my medical decision making (see chart for details).    47 year old female presents with upper respiratory symptoms.  Appears to have an upper respiratory infection.  Too early to diagnose sinusitis.  Patient does have otitis media.  Treating with Augmentin.  Tussionex  for cough.  Final Clinical Impressions(s) / UC Diagnoses   Final diagnoses:  Acute suppurative otitis media of right ear without spontaneous rupture of tympanic membrane, recurrence not specified  Acute upper respiratory infection     Discharge Instructions     Antibiotic as prescribed.  Rest, fluids.  Cough medication as needed.  Take care  Dr. Lacinda Axon    ED Prescriptions    Medication Sig Dispense Auth. Provider   amoxicillin-clavulanate (AUGMENTIN) 875-125 MG tablet Take 1 tablet by mouth every 12 (twelve) hours. 20 tablet Timnath, Cloee Dunwoody G, DO   chlorpheniramine-HYDROcodone (TUSSIONEX PENNKINETIC ER) 10-8 MG/5ML SUER Take 5 mLs by mouth every 12 (twelve) hours as needed. 115 mL Coral Spikes, DO     Controlled Substance Prescriptions [place] Controlled Substance Registry consulted? Not Applicable   Coral Spikes, [place] 01/26/18 2761

## 2019-10-21 ENCOUNTER — Other Ambulatory Visit: Payer: Self-pay | Admitting: Family Medicine

## 2019-10-21 DIAGNOSIS — Z1231 Encounter for screening mammogram for malignant neoplasm of breast: Secondary | ICD-10-CM

## 2019-11-12 ENCOUNTER — Ambulatory Visit
Admission: RE | Admit: 2019-11-12 | Discharge: 2019-11-12 | Disposition: A | Discharge status: Home / Self Care | Payer: BC Managed Care – PPO | Source: Ambulatory Visit | Attending: Family Medicine | Admitting: Family Medicine

## 2019-11-12 ENCOUNTER — Other Ambulatory Visit: Payer: Self-pay

## 2019-11-12 DIAGNOSIS — Z1231 Encounter for screening mammogram for malignant neoplasm of breast: Secondary | ICD-10-CM | POA: Diagnosis not present

## 2019-11-19 ENCOUNTER — Other Ambulatory Visit: Payer: Self-pay | Admitting: Family Medicine

## 2019-11-19 DIAGNOSIS — R928 Other abnormal and inconclusive findings on diagnostic imaging of breast: Secondary | ICD-10-CM

## 2019-12-01 ENCOUNTER — Ambulatory Visit
Admission: RE | Admit: 2019-12-01 | Discharge: 2019-12-01 | Disposition: A | Discharge status: Home / Self Care | Payer: BC Managed Care – PPO | Source: Ambulatory Visit | Attending: Family Medicine | Admitting: Family Medicine

## 2019-12-01 ENCOUNTER — Other Ambulatory Visit: Payer: Self-pay

## 2019-12-01 DIAGNOSIS — R928 Other abnormal and inconclusive findings on diagnostic imaging of breast: Secondary | ICD-10-CM | POA: Insufficient documentation

## 2019-12-16 ENCOUNTER — Other Ambulatory Visit: Payer: Self-pay | Admitting: Student

## 2019-12-16 DIAGNOSIS — R140 Abdominal distension (gaseous): Secondary | ICD-10-CM

## 2019-12-16 DIAGNOSIS — R110 Nausea: Secondary | ICD-10-CM

## 2019-12-16 DIAGNOSIS — R198 Other specified symptoms and signs involving the digestive system and abdomen: Secondary | ICD-10-CM

## 2019-12-16 DIAGNOSIS — R160 Hepatomegaly, not elsewhere classified: Secondary | ICD-10-CM

## 2019-12-23 ENCOUNTER — Ambulatory Visit
Admission: RE | Admit: 2019-12-23 | Discharge: 2019-12-23 | Disposition: A | Discharge status: Home / Self Care | Payer: BC Managed Care – PPO | Source: Ambulatory Visit | Attending: Student | Admitting: Student

## 2019-12-23 ENCOUNTER — Other Ambulatory Visit: Payer: Self-pay

## 2019-12-23 DIAGNOSIS — R160 Hepatomegaly, not elsewhere classified: Secondary | ICD-10-CM | POA: Insufficient documentation

## 2019-12-23 DIAGNOSIS — R140 Abdominal distension (gaseous): Secondary | ICD-10-CM | POA: Insufficient documentation

## 2019-12-23 DIAGNOSIS — R198 Other specified symptoms and signs involving the digestive system and abdomen: Secondary | ICD-10-CM | POA: Insufficient documentation

## 2019-12-23 DIAGNOSIS — R110 Nausea: Secondary | ICD-10-CM

## 2020-11-26 ENCOUNTER — Ambulatory Visit
Admission: RE | Admit: 2020-11-26 | Discharge: 2020-11-26 | Disposition: A | Discharge status: Home / Self Care | Payer: BC Managed Care – PPO | Source: Ambulatory Visit

## 2020-11-26 ENCOUNTER — Other Ambulatory Visit: Payer: Self-pay

## 2020-11-26 VITALS — BP 136/80 | HR 79 | Temp 98.3°F | Resp 16 | Ht 62.0 in | Wt 182.0 lb

## 2020-11-26 DIAGNOSIS — M25562 Pain in left knee: Secondary | ICD-10-CM | POA: Diagnosis not present

## 2020-11-26 MED ORDER — PREDNISONE 50 MG PO TABS
ORAL_TABLET | ORAL | 0 refills | Status: DC
Start: 2020-11-26 — End: 2022-10-03

## 2020-11-26 NOTE — ED Triage Notes (Signed)
Pt reports pain for past 3-4 days. Pain from left hip radiating down to left lower leg, swelling around knee, pain to back of knee. Feels tight around the knee.

## 2020-11-26 NOTE — ED Provider Notes (Signed)
[facility]    CSN: [id number] Arrival date & time: 11/26/20  1334      History   Chief Complaint Chief Complaint  Patient presents with   Knee Pain    HPI Lisa Wilkins is a 50 y.o. female.   HPI  50 year old female here for evaluation of left knee pain and swelling.  Patient reports that she has been experiencing a deep ache in her left knee for the past 3 to 4 days.  The pain radiates up and down her leg and she states that it feels tight in the back of her knee.  She does have a history of a previous Baker's cyst which was surgically excised several years ago.  She also has a history of rheumatoid arthritis but she has not been on any of her RA medication for the past 2 years since COVID.  Patient also denies any known injury.  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      Home Medications    Prior to Admission medications   Medication Sig Start Date End Date Taking? Authorizing Provider  predniSONE (DELTASONE) 50 MG tablet Take 1 tablet daily by mouth for 5 days. 11/26/20  Yes Margarette Canada, NP   acetaminophen (TYLENOL) 650 MG CR tablet Take 1,300 mg by mouth every 8 (eight) hours as needed for pain.    [provider]  albuterol (PROVENTIL HFA;VENTOLIN HFA) 108 (90 Base) MCG/ACT inhaler Inhale 1-2 puffs into the lungs every 6 (six) hours as needed for wheezing or shortness of breath. 07/09/16   Norval Gable, MD  albuterol (PROVENTIL HFA;VENTOLIN HFA) 108 (90 Base) MCG/ACT inhaler Inhale 2 puffs into the lungs every 4 (four) hours as needed for wheezing. 03/06/17   Marylene Land, NP  omeprazole (PRILOSEC) 40 MG capsule Take 40 mg by mouth daily. 11/15/20   [provider]  sulindac (CLINORIL) 200 MG tablet  01/09/18   [provider]  SYNTHROID 137 MCG tablet Take 137 mcg by mouth every morning. 11/13/20   [provider]    Family History Family History  Problem Relation Age of Onset   Rheum arthritis Mother    Stroke Father    Breast cancer Neg Hx     Social History Social History   Tobacco Use   Smoking status: Heavy Smoker    Packs/day: 1.00    Years: 20.00    Pack years: 20.00    Types: Cigarettes   Smokeless tobacco: Never  Vaping Use   Vaping Use:  Never used  Substance Use Topics   Alcohol use: No   Drug use: No     Allergies   Elemental sulfur and Remicade [infliximab]   Review of Systems Review of Systems  Constitutional:  Negative for activity change, appetite change and fever.  Musculoskeletal:  Positive for arthralgias and joint swelling. Negative for myalgias.  Skin:  Negative for color change.  Neurological:  Negative for weakness and numbness.  Hematological: Negative.   Psychiatric/Behavioral: Negative.      Physical Exam Triage Vital Signs ED Triage Vitals  Enc Vitals Group     BP 11/26/20 1348 136/80     Pulse Rate 11/26/20 1348 79     Resp 11/26/20 1348 16     Temp 11/26/20 1348 98.3 F (36.8 C)     Temp Source 11/26/20 1348 Oral     SpO2 11/26/20 1348 98 %     Weight 11/26/20 1343 182 lb (82.6  kg)     Height 11/26/20 1343 '5\' 2"'$  (1.575 m)     Head Circumference --      Peak Flow --      Pain Score 11/26/20 1343 4     Pain Loc --      Pain Edu? --      Excl. in [place]? --    No data found.  Updated Vital Signs BP 136/80 (BP Location: Right Arm)   Pulse 79   Temp 98.3 F (36.8 C) (Oral)   Resp 16   Ht '5\' 2"'$  (1.575 m)   Wt 182 lb (82.6 kg)   SpO2 98%   BMI 33.29 kg/m   Visual Acuity Right Eye Distance:   Left Eye Distance:   Bilateral Distance:    Right Eye Near:   Left Eye Near:    Bilateral Near:     Physical Exam Vitals and nursing note reviewed.  Constitutional:      General: She is not in acute distress.    Appearance: Normal appearance. She is normal weight. She is not ill-appearing.  HENT:     Head: Normocephalic and atraumatic.  Musculoskeletal:        General: Swelling and tenderness present. No deformity or signs of injury.  Skin:    General: Skin is warm and dry.     Capillary Refill: Capillary refill takes less than 2 seconds.     Findings: No bruising or erythema.  Neurological:     General: No focal deficit present.     Mental Status: She is alert and oriented to person, place, and time.     Sensory: No sensory deficit.     Motor: No weakness.  Psychiatric:        Mood and Affect: Mood normal.        Thought Content: Thought content normal.        Judgment: Judgment normal.     UC Treatments / Results  Labs (all labs ordered are listed, but only abnormal results are displayed) Labs Reviewed - No data to display  EKG   Radiology No results found.  Procedures Procedures (including critical care time)  Medications Ordered in UC Medications - No data to display  Initial Impression / Assessment and Plan / UC Course  I have reviewed the triage vital signs and the nursing notes.  Pertinent labs & imaging results that were available during my care of the patient were reviewed by me and considered in my medical decision making (see  chart for details).  Patient  is a very pleasant, nontoxic-appearing 50 year old female here for evaluation of pain and swelling to her left knee as outlined in the HPI above.  Patient's physical exam reveals a left knee that is in normal anatomical alignment.  There is no tenderness to palpation of the patella, tibial tuberosity, patellar tendon, quadriceps complex, medial or lateral joint line.  Patient does have tenderness and fullness with palpation of the popliteal fossa.  Patient is unable to fully extend her left knee either actively or passively due to the swelling in the back of her knee.  At full extension there is a large, well-circumscribed duck egg area of swelling in the popliteal space.  Patient has no pain with varus and valgus stress application.  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  She does have a history of RA and has been off her medications for 2 years.  She states this does also feel similar to when she has an RA flare.  We will treat patient with prednisone 50 mg daily for 5 days, compression, elevation and ice application to decrease swelling, and I have instructed her to follow-up with orthopedics next week if her symptoms not improve over the weekend.   Final Clinical Impressions(s) / UC Diagnoses   Final diagnoses:  Acute pain of left knee     Discharge Instructions      Start the Prednisone tomorrow morning and take it each morning at breakfast for 5 days.  Wear a compression sleeve to provide stability and prevent injury.  Keep your left knee elevated as much as possible and apply ice for 20 minutes at a time, 2-3 times a day.  If your symptoms have not improved by Monday follow-up with your orthopedist.      ED Prescriptions     Medication Sig Dispense Auth. Provider   predniSONE (DELTASONE) 50 MG tablet Take 1 tablet daily by mouth for 5 days. 5 tablet Margarette Canada, NP      PDMP not reviewed this encounter.   Margarette Canada, NP 11/26/20  1413

## 2020-11-26 NOTE — Discharge Instructions (Addendum)
Start the Prednisone tomorrow morning and take it each morning at breakfast for 5 days.  Wear a compression sleeve to provide stability and prevent injury.  Keep your left knee elevated as much as possible and apply ice for 20 minutes at a time, 2-3 times a day.  If your symptoms have not improved by Monday follow-up with your orthopedist.

## 2021-07-25 IMAGING — MG MM DIGITAL DIAGNOSTIC UNILAT*R* W/ TOMO W/ CAD
4 series · 4 of 12 positions shown · non-contrast
Comparison: Previous exam(s).

CLINICAL DATA: Recall from screening mammography with
tomosynthesis, possible subtle architectural distortion the OUTER
RIGHT breast at POSTERIOR depth visible only on the CC images.

EXAM:
DIGITAL DIAGNOSTIC UNILATERAL RIGHT MAMMOGRAM WITH TOMO AND CAD

[R CC synth-2D (1 of 2)]
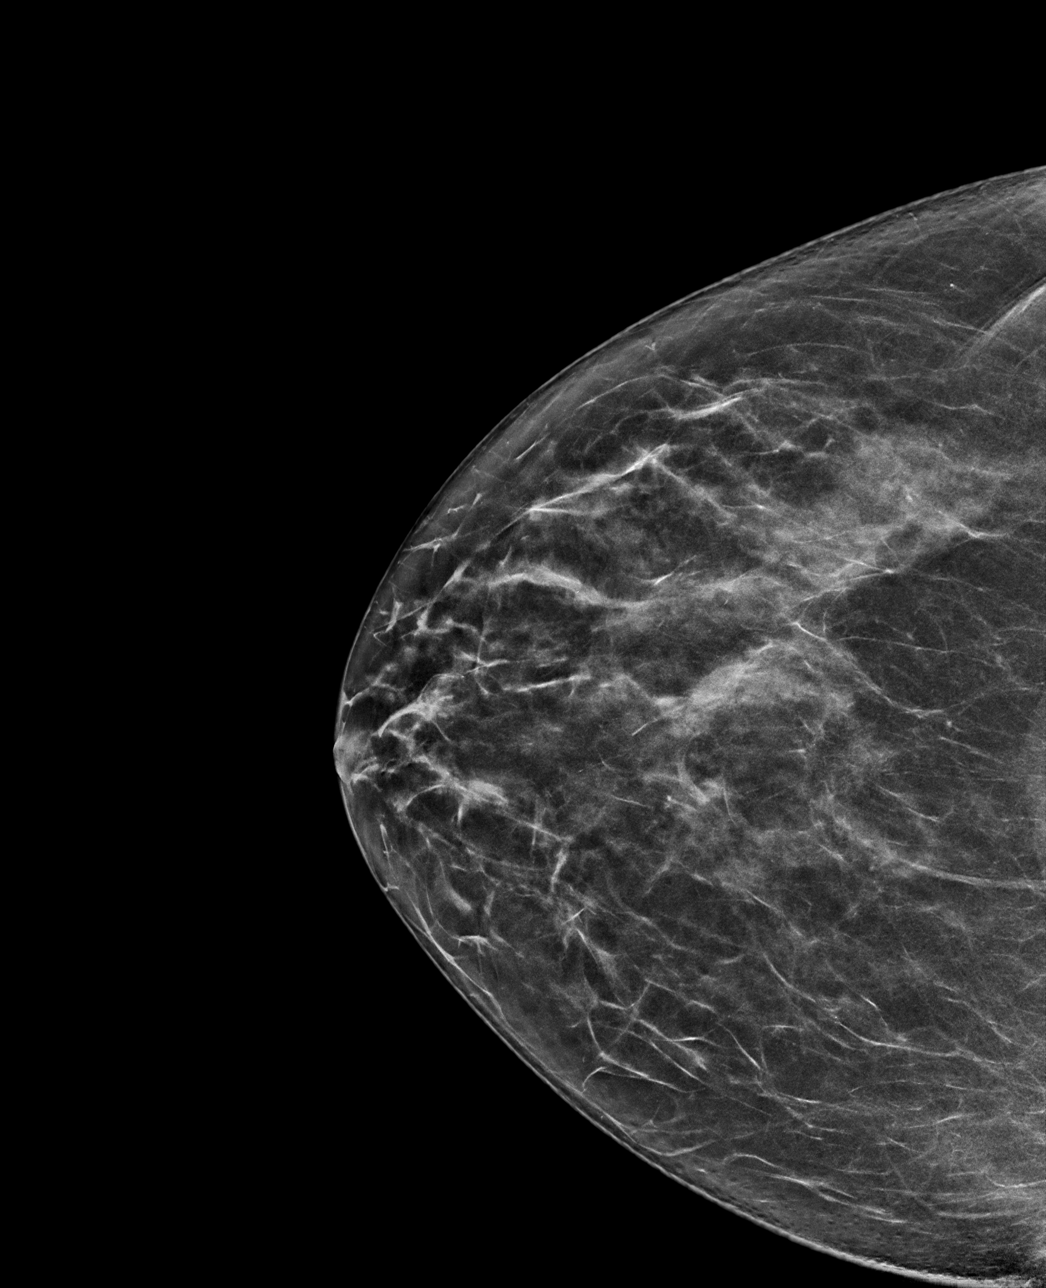

[R CC synth-2D (2 of 2)]
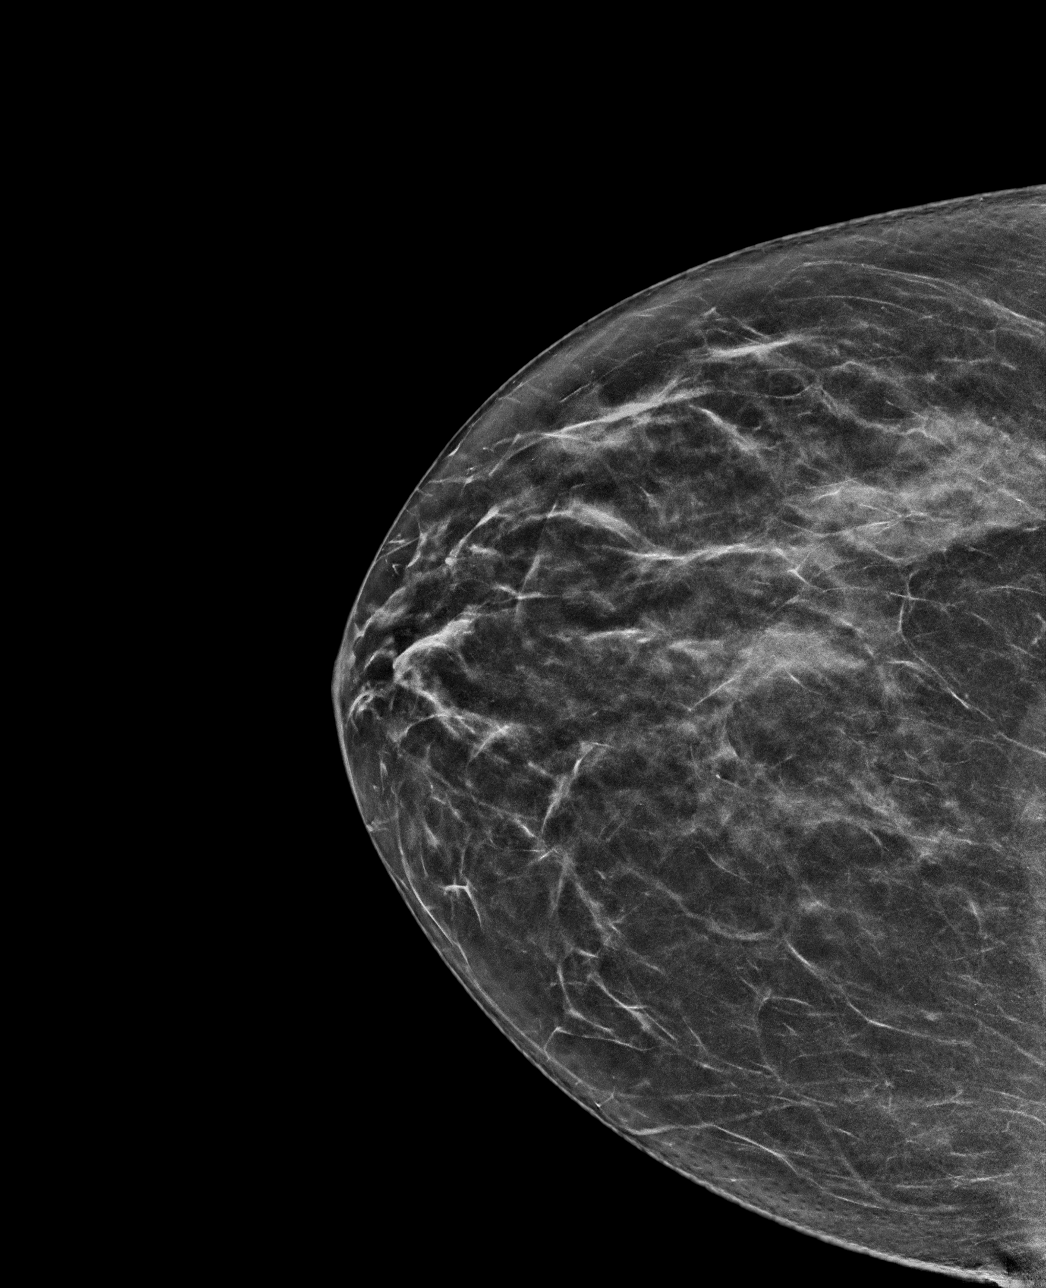

[R CC tomo (1 of 2) · tomo slice 35/69.0]
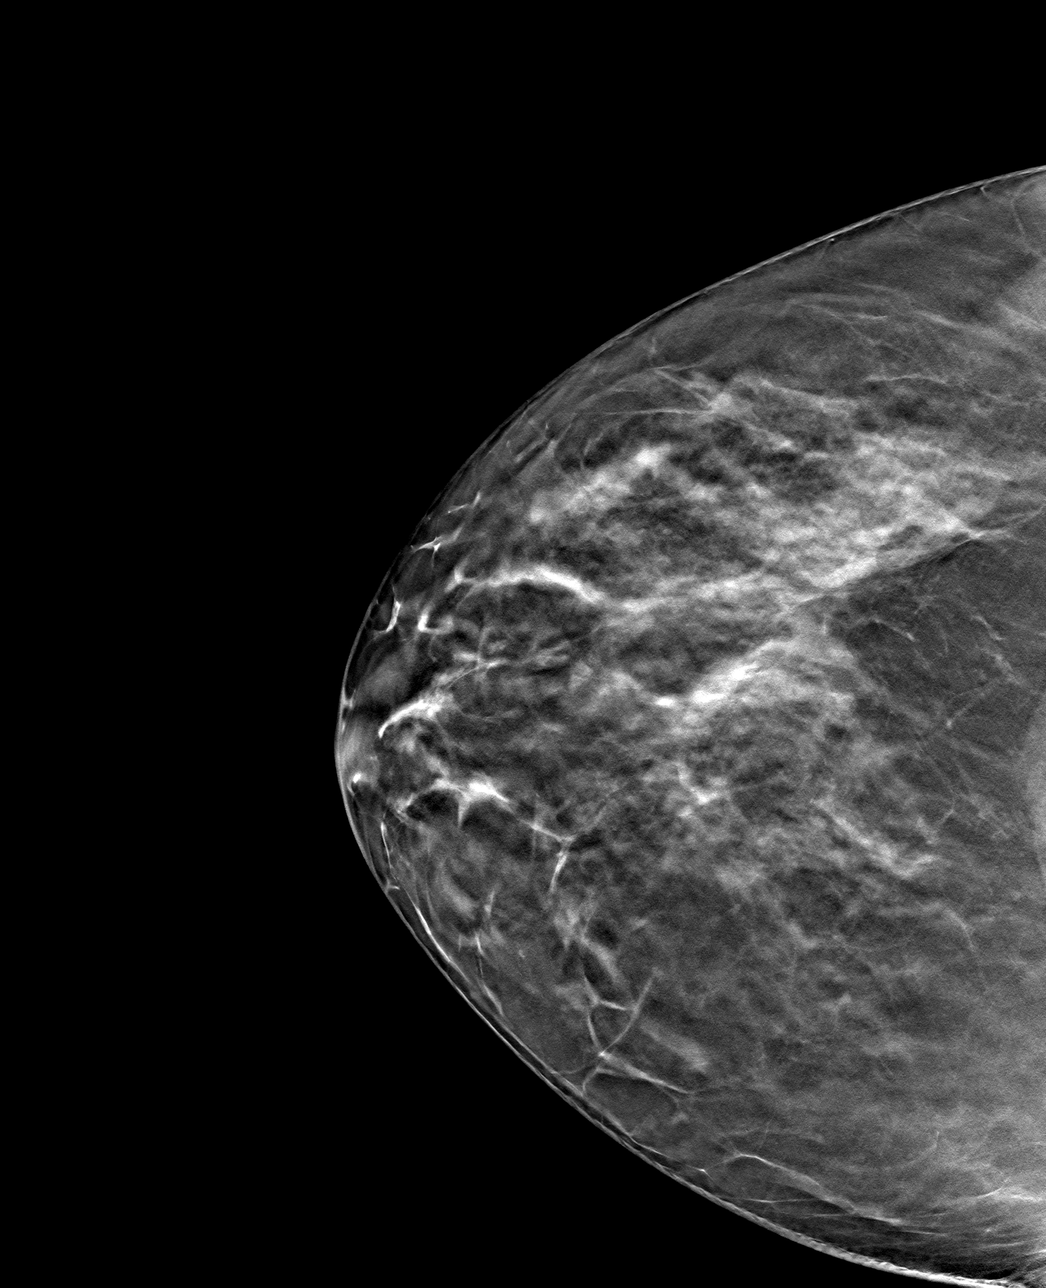

[R CC tomo (2 of 2) · tomo slice 31/61.0]
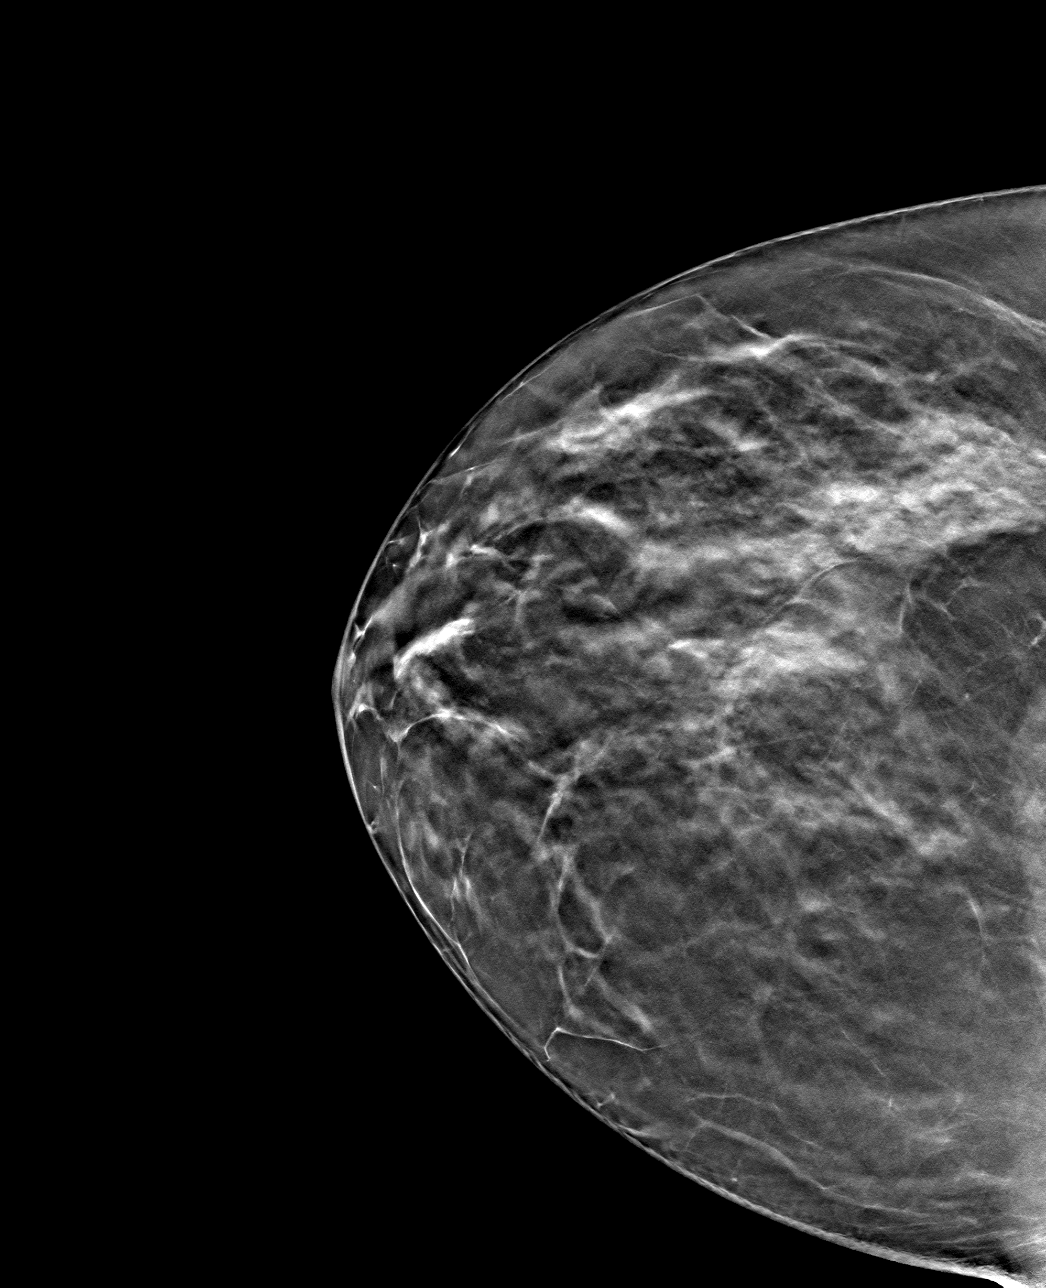

[4 of 12 positions shown; findings below may reference images not displayed]

ACR Breast Density Category b: There are scattered areas of
fibroglandular density.
FINDINGS: Tomosynthesis and synthesized spot-compression CC view of the area
of concern in the RIGHT breast and tomosynthesis and synthesized
full field mediolateral and medially rolled CC views of the RIGHT
breast were obtained.

No persistent architectural distortion on the spot compression
images. The screening mammographic finding corresponds to
overlapping normal Cooper's ligaments and normal fibroglandular
tissue. This is confirmed on the medially rolled CC images.

No findings suspicious for malignancy in the RIGHT breast.

Mammographic images were processed with CAD.
IMPRESSION: No mammographic evidence of malignancy involving the RIGHT breast.

RECOMMENDATION:
Screening mammogram in one year.(Code:[id number])

I have discussed the findings and recommendations with the patient.
If applicable, a reminder letter will be sent to the patient
regarding the next appointment.

BI-RADS CATEGORY  1: Negative.

## 2022-08-08 ENCOUNTER — Encounter: Payer: Self-pay | Admitting: Family Medicine

## 2022-08-08 ENCOUNTER — Other Ambulatory Visit: Payer: Self-pay | Admitting: Family Medicine

## 2022-08-08 DIAGNOSIS — R748 Abnormal levels of other serum enzymes: Secondary | ICD-10-CM

## 2022-08-29 ENCOUNTER — Ambulatory Visit
Admission: RE | Admit: 2022-08-29 | Discharge: 2022-08-29 | Disposition: A | Discharge status: Home / Self Care | Payer: Medicare Other | Source: Ambulatory Visit | Attending: Family Medicine | Admitting: Family Medicine

## 2022-08-29 DIAGNOSIS — R748 Abnormal levels of other serum enzymes: Secondary | ICD-10-CM

## 2022-10-03 ENCOUNTER — Ambulatory Visit: Payer: BC Managed Care – PPO | Attending: Cardiovascular Disease | Admitting: Cardiovascular Disease

## 2022-10-03 ENCOUNTER — Encounter: Payer: Self-pay | Admitting: Cardiovascular Disease

## 2022-10-03 ENCOUNTER — Ambulatory Visit (INDEPENDENT_AMBULATORY_CARE_PROVIDER_SITE_OTHER): Payer: BC Managed Care – PPO

## 2022-10-03 VITALS — BP 122/84 | HR 70 | Ht 62.0 in | Wt 158.6 lb

## 2022-10-03 DIAGNOSIS — R002 Palpitations: Secondary | ICD-10-CM | POA: Diagnosis not present

## 2022-10-03 DIAGNOSIS — Z8249 Family history of ischemic heart disease and other diseases of the circulatory system: Secondary | ICD-10-CM

## 2022-10-03 DIAGNOSIS — E782 Mixed hyperlipidemia: Secondary | ICD-10-CM

## 2022-10-03 DIAGNOSIS — R0789 Other chest pain: Secondary | ICD-10-CM | POA: Diagnosis not present

## 2022-10-03 DIAGNOSIS — G4733 Obstructive sleep apnea (adult) (pediatric): Secondary | ICD-10-CM

## 2022-10-03 NOTE — Progress Notes (Unsigned)
Enrolled for [employer] to mail a ZIO XT long term holter monitor to the patients address on file.  

## 2022-10-03 NOTE — Assessment & Plan Note (Signed)
Ms. Lisa Wilkins  was referred to me by Dr. Prince Rome for evaluation of palpitations.  She developed these several years ago after being put on supranormal doses of thyroid replacement therapy.  Once her dose was adjusted she continued to have palpitations which occur several times a week lasting up to 5 minutes at a time.  She does admit to drinking caffeine (2 cups of coffee a day) as well as tea.  She stopped drinking soft drinks several months ago.  I am going to get 2D echo and a 2-week Zio patch to further evaluate.  We talked about weaning herself off caffeine.

## 2022-10-03 NOTE — Progress Notes (Signed)
10/03/2022 Lisa Wilkins   04/10/1970  [id number]  Primary Physician Jerl Mina, MD Primary Cardiologist: Runell Gess MD Nicholes Calamity, [place]  HPI:  Lisa Wilkins is a 52 y.o. moderately overweight married Caucasian female mother of 3, grandmother of 7 grandchildren referred by Dr. Prince Rome for evaluation of palpitations.  She has been disabled for years because of rheumatoid arthritis since her 64s.  Risk factors otherwise include 35-pack-year tobacco abuse recalcitrant to resector modification and untreated mild hyperlipidemia.  Her father did have bypass surgery in his late 50s/early 19s.  She has never had a heart attack or stroke.  She has had hypothyroidism on replacement therapy since her 94s.  She was put on additional Synthroid replacement several years ago and developed tachypalpitations which did not resolve when her dose was adjusted.  She also drinks at least 2 cups of coffee a day, some tea and soft drinks which were discontinued.  She has palpitations several times a week lasting 5 minutes at a time as well as atypical chest pain characterized as chest heaviness that last for hours at a time.  She was also diagnosed with obstructive sleep apnea in the past.  Fortunately never pursued CPAP.   Current Meds  Medication Sig   acetaminophen (TYLENOL) 650 MG CR tablet Take 1,300 mg by mouth every 8 (eight) hours as needed for pain.   albuterol (PROVENTIL HFA;VENTOLIN HFA) 108 (90 Base) MCG/ACT inhaler Inhale 1-2 puffs into the lungs every 6 (six) hours as needed for wheezing or shortness of breath.   sulindac (CLINORIL) 200 MG tablet    SYNTHROID 137 MCG tablet Take 137 mcg by mouth every morning.     Allergies  Allergen Reactions   Elemental Sulfur Nausea Only   Remicade [Infliximab] Hives   Tramadol Anxiety    Social History   Socioeconomic History   Marital status: Married    Spouse name: Not on file   Number of children: Not on file   Years  of education: Not on file   Highest education level: Not on file  Occupational History   Not on file  Tobacco Use   Smoking status: Heavy Smoker    Packs/day: 1.00    Years: 20.00    Additional pack years: 0.00    Total pack years: 20.00    Types: Cigarettes   Smokeless tobacco: Never  Vaping Use   Vaping Use: Never used  Substance and Sexual Activity   Alcohol use: No   Drug use: No   Sexual activity: Yes    Birth control/protection: Surgical  Other Topics Concern   Not on file  Social History Narrative   Not on file   Social Determinants of Health   Financial Resource Strain: Not on file  Food Insecurity: Not on file  Transportation Needs: Not on file  Physical Activity: Not on file  Stress: Not on file  Social Connections: Not on file  Intimate Partner Violence: Not on file     Review of Systems: General: negative for chills, fever, night sweats or weight changes.  Cardiovascular: negative for chest pain, dyspnea on exertion, edema, orthopnea, palpitations, paroxysmal nocturnal dyspnea or shortness of breath Dermatological: negative for rash Respiratory: negative for cough or wheezing Urologic: negative for hematuria Abdominal: negative for nausea, vomiting, diarrhea, bright red blood per rectum, melena, or hematemesis Neurologic: negative for visual changes, syncope, or dizziness All other systems reviewed and are otherwise negative except as noted above.  Blood pressure 122/84, pulse 70, height 5\' 2"  (1.575 m), weight 158 lb 9.6 oz (71.9 kg), SpO2 94 %.  General appearance: alert and no distress Neck: no adenopathy, no carotid bruit, no JVD, supple, symmetrical, trachea midline, and thyroid not enlarged, symmetric, no tenderness/mass/nodules Lungs: clear to auscultation bilaterally Heart: regular rate and rhythm, S1, S2 normal, no murmur, click, rub or gallop Extremities: extremities normal, atraumatic, no cyanosis or edema Pulses: 2+ and symmetric Skin:  Skin color, texture, turgor normal. No rashes or lesions Neurologic: Grossly normal  EKG EKG Interpretation Date/Time:  Wednesday October 03 2022 09:58:28 EDT Ventricular Rate:  70 PR Interval:  126 QRS Duration:  84 QT Interval:  398 QTC Calculation: 429 R Axis:   67  Text Interpretation: Normal sinus rhythm Normal ECG No previous ECGs available Confirmed by Nanetta Batty [id number]) on 10/03/2022 10:22:18 AM    ASSESSMENT AND PLAN:   Palpitations Ms. Hunsucker  was referred to me by Dr. Prince Rome for evaluation of palpitations.  She developed these several years ago after being put on supranormal doses of thyroid replacement therapy.  Once her dose was adjusted she continued to have palpitations which occur several times a week lasting up to 5 minutes at a time.  She does admit to drinking caffeine (2 cups of coffee a day) as well as tea.  She stopped drinking soft drinks several months ago.  I am going to get 2D echo and a 2-week Zio patch to further evaluate.  We talked about weaning herself off caffeine.  Obstructive sleep apnea History of nocturnal snoring and daytime somnolence.  She apparently had a sleep study in the past which was abnormal which led to the prescription for CPAP which unfortunately was never filled for unclear reasons.  We will readdress this.  Her stop bang score was 5.  Family history of heart disease Father had CABG in his late 23s, early 35s.  Atypical chest pain Chest heaviness last for hours at a time.  This does not necessarily sound ischemic but with her family history and mild hyperlipidemia will get a coronary calcium score to further evaluate.  Hyperlipidemia History of hyperlipidemia with lab work performed by her PCP revealing total cholesterol 196, LDL of 112 and HDL 28, currently not on statin therapy.  Will get a coronary calcium score to stratify.     Runell Gess MD FACP,FACC,FAHA, [facility] 10/03/2022 10:43 AM

## 2022-10-03 NOTE — Assessment & Plan Note (Addendum)
History of nocturnal snoring and daytime somnolence.  She apparently had a sleep study in the past which was abnormal which led to the prescription for CPAP which unfortunately was never filled for unclear reasons.  We will readdress this.  Her stop bang score was 5.

## 2022-10-03 NOTE — Assessment & Plan Note (Signed)
History of hyperlipidemia with lab work performed by her PCP revealing total cholesterol 196, LDL of 112 and HDL 28, currently not on statin therapy.  Will get a coronary calcium score to stratify.

## 2022-10-03 NOTE — Patient Instructions (Addendum)
Medication Instructions:  Your physician recommends that you continue on your current medications as directed. Please refer to the Current Medication list given to you today.  *If you need a refill on your cardiac medications before your next appointment, please call your pharmacy*   Testing/Procedures: Your physician has requested that you have an echocardiogram. Echocardiography is a painless test that uses sound waves to create images of your heart. It provides your doctor with information about the size and shape of your heart and how well your heart's chambers and valves are working. This procedure takes approximately one hour. There are no restrictions for this procedure. Please do NOT wear cologne, perfume, aftershave, or lotions (deodorant is allowed). Please arrive 15 minutes prior to your appointment time.  Dr. Allyson Sabal has ordered a CT coronary calcium score.   Test locations:  [facility]  [facility]  This is $99 out of pocket.   Coronary CalciumScan A coronary calcium scan is an imaging test used to look for deposits of calcium and other fatty materials (plaques) in the inner lining of the blood vessels of the heart (coronary arteries). These deposits of calcium and plaques can partly clog and narrow the coronary arteries without producing any symptoms or warning signs. This puts a person at risk for a heart attack. This test can detect these deposits before symptoms develop. Tell a health care provider about: Any allergies you have. All medicines you are taking, including vitamins, herbs, eye drops, creams, and over-the-counter medicines. Any problems you or family members have had with anesthetic medicines. Any blood disorders you have. Any surgeries you have had. Any medical conditions you have. Whether you are pregnant or may be pregnant. What are the risks? Generally, this is a  safe procedure. However, problems may occur, including: Harm to a pregnant woman and her unborn baby. This test involves the use of radiation. Radiation exposure can be dangerous to a pregnant woman and her unborn baby. If you are pregnant, you generally should not have this procedure done. Slight increase in the risk of cancer. This is because of the radiation involved in the test. What happens before the procedure? No preparation is needed for this procedure. What happens during the procedure? You will undress and remove any jewelry around your neck or chest. You will put on a hospital gown. Sticky electrodes will be placed on your chest. The electrodes will be connected to an electrocardiogram (ECG) machine to record a tracing of the electrical activity of your heart. A CT scanner will take pictures of your heart. During this time, you will be asked to lie still and hold your breath for 2-3 seconds while a picture of your heart is being taken. The procedure may vary among health care providers and hospitals. What happens after the procedure? You can get dressed. You can return to your normal activities. It is up to you to get the results of your test. Ask your health care provider, or the department that is doing the test, when your results will be ready. Summary A coronary calcium scan is an imaging test used to look for deposits of calcium and other fatty materials (plaques) in the inner lining of the blood vessels of the heart (coronary arteries). Generally, this is a safe procedure. Tell your health care provider if you are pregnant or may be pregnant. No preparation is needed for this procedure. A CT scanner will take pictures  of your heart. You can return to your normal activities after the scan is done. This information is not intended to replace advice given to you by your health care provider. Make sure you discuss any questions you have with your health care provider. Document  Released: 09/15/2007 Document Revised: 02/06/2016 Document Reviewed: 02/06/2016 Elsevier Interactive Patient Education  2017 Elsevier Inc.   Christena Deem- Long Term Monitor Instructions  Your physician has requested you wear a ZIO patch monitor for 14 days.  This is a single patch monitor. [employer] supplies one patch monitor per enrollment. Additional stickers are not available. Please do not apply patch if you will be having a Nuclear Stress Test,  Echocardiogram, Cardiac CT, MRI, or Chest Xray during the period you would be wearing the  monitor. The patch cannot be worn during these tests. You cannot remove and re-apply the  ZIO XT patch monitor.  Your ZIO patch monitor will be mailed 3 day USPS to your address on file. It may take 3-5 days  to receive your monitor after you have been enrolled.  Once you have received your monitor, please review the enclosed instructions. Your monitor  has already been registered assigning a specific monitor serial # to you.  Billing and Patient Assistance Program Information  We have supplied [employer] with any of your insurance information on file for billing purposes. [employer] offers a sliding scale Patient Assistance Program for patients that do not have  insurance, or whose insurance does not completely cover the cost of the ZIO monitor.  You must apply for the Patient Assistance Program to qualify for this discounted rate.  To apply, please call [employer] at [phone], select option 4, select option 2, ask to apply for  Patient Assistance Program. Meredeth Ide will ask your household income, and how many people  are in your household. They will quote your out-of-pocket cost based on that information.  [employer] will also be able to set up a 55-month, interest-free payment plan if needed.  Applying the monitor   Shave hair from upper left chest.  Hold abrader disc by orange tab. Rub abrader in 40 strokes over the upper left chest as  indicated in your monitor  instructions.  Clean area with 4 enclosed alcohol pads. Let dry.  Apply patch as indicated in monitor instructions. Patch will be placed under collarbone on left  side of chest with arrow pointing upward.  Rub patch adhesive wings for 2 minutes. Remove white label marked "1". Remove the white  label marked "2". Rub patch adhesive wings for 2 additional minutes.  While looking in a mirror, press and release button in center of patch. A small green light will  flash 3-4 times. This will be your only indicator that the monitor has been turned on.  Do not shower for the first 24 hours. You may shower after the first 24 hours.  Press the button if you feel a symptom. You will hear a small click. Record Date, Time and  Symptom in the Patient Logbook.  When you are ready to remove the patch, follow instructions on the last 2 pages of Patient  Logbook. Stick patch monitor onto the last page of Patient Logbook.  Place Patient Logbook in the blue and white box. Use locking tab on box and tape box closed  securely. The blue and white box has prepaid postage on it. Please place it in the mailbox as  soon as possible. Your physician should have your test results approximately 7  days after the  monitor has been mailed back to [place].  Call [facility] Customer Care at [phone] if you have questions regarding  your ZIO XT patch monitor. Call them immediately if you see an orange light blinking on your  monitor.  If your monitor falls off in less than 4 days, contact our Monitor department at [phone].  If your monitor becomes loose or falls off after 4 days call [employer] at [phone] for  suggestions on securing your monitor  WatchPAT?  Is a FDA cleared portable home sleep study test that uses a watch and 3 points of contact to monitor 7 different channels, including your heart rate, oxygen saturations, body position, snoring, and chest motion.  The study is easy to use from the  comfort of your own home and accurately detect sleep apnea.  Before bed, you attach the chest sensor, attached the sleep apnea bracelet to your nondominant hand, and attach the finger probe.  After the study, the raw data is downloaded from the watch and scored for apnea events.   For more information: [email or web address]  Patient Testing Instructions:  Do not put battery into the device until bedtime when you are ready to begin the test. Please call the support number if you need assistance after following the instructions below: 24 hour support line- [phone] or [employer] support at [phone] (option 2)  Download the IntelWatchPAT One" app through the google play store or App Store  Be sure to turn on or enable access to bluetooth in settlings on your smartphone/ device  Make sure no other bluetooth devices are on and within the vicinity of your smartphone/ device and WatchPAT watch during testing.  Make sure to leave your smart phone/ device plugged in and charging all night.  When ready for bed:  Follow the instructions step by step in the WatchPAT One App to activate the testing device. For additional instructions, including video instruction, visit the WatchPAT One video on Youtube. You can search for WatchPat One within Youtube (video is 4 minutes and 18 seconds) or enter: https://youtube/watch?[email or web address]_vbiwxE Please note: You will be prompted to enter a Pin to connect via bluetooth when starting the test. The PIN will be assigned to you when you receive the test.  The device is disposable, but it recommended that you retain the device until you receive a call letting you know the study has been received and the results have been interpreted.  We will let you know if the study did not transmit to [place] properly after the test is completed. You do not need to call us to confirm the receipt of the test.  Please complete the test within 48 hours of receiving PIN.    Frequently Asked Questions:  What is Watch Dennie Bible one?  A single use fully disposable home sleep apnea testing device and will not need to be returned after completion.  What are the requirements to use WatchPAT one?  The be able to have a successful watchpat one sleep study, you should have your Watch pat one device, your smart phone, watch pat one app, your PIN number and Internet access What type of phone do I need?  You should have a smart phone that uses Android 5.1 and above or any Iphone with IOS 10 and above How can I download the WatchPAT one app?  Based on your device type search for WatchPAT one app either in google play for android devices or APP store for Iphone's Where will I  get my PIN for the study?  Your PIN will be provided by your physician's office. It is used for authentication and if you lose/forget your PIN, please reach out to your providers office.  I do not have Internet at home. Can I do WatchPAT one study?  WatchPAT One needs Internet connection throughout the night to be able to transmit the sleep data. You can use your home/local internet or your cellular's data package. However, it is always recommended to use home/local Internet. It is estimated that between 20MB-30MB will be used with each study.However, the application will be looking for space in the phone to start the study.  What happens if I lose internet or bluetooth connection?  During the internet disconnection, your phone will not be able to transmit the sleep data. All the data, will be stored in your phone. As soon as the internet connection is back on, the phone will being sending the sleep data. During the bluetooth disconnection, WatchPAT one will not be able to to send the sleep data to your phone. Data will be kept in the [facility] one until two devices have bluetooth connection back on. As soon as the connection is back on, WatchPAT one will send the sleep data to the phone.  How long do I need  to wear the WatchPAT one?  After you start the study, you should wear the device at least 6 hours.  How far should I keep my phone from the device?  During the night, your phone should be within 15 feet.  What happens if I leave the room for restroom or other reasons?  Leaving the room for any reason will not cause any problem. As soon as your get back to the room, both devices will reconnect and will continue to send the sleep data. Can I use my phone during the sleep study?  Yes, you can use your phone as usual during the study. But it is recommended to put your watchpat one on when you are ready to go to bed.  How will I get my study results?  A soon as you completed your study, your sleep data will be sent to the provider. They will then share the results with you when they are ready.     Follow-Up: At [facility], you and your health needs are our priority.  As part of our continuing mission to provide you with exceptional heart care, we have created designated Provider Care Teams.  These Care Teams include your primary Cardiologist (physician) and Advanced Practice Providers (APPs -  Physician Assistants and Nurse Practitioners) who all work together to provide you with the care you need, when you need it.  We recommend signing up for the patient portal called "MyChart".  Sign up information is provided on this After Visit Summary.  MyChart is used to connect with patients for Virtual Visits (Telemedicine).  Patients are able to view lab/test results, encounter notes, upcoming appointments, etc.  Non-urgent messages can be sent to your provider as well.   To learn more about what you can do with MyChart, go to [email or web address].    Your next appointment:   3 month(s)  Provider:   Nanetta Batty, MD

## 2022-10-03 NOTE — Assessment & Plan Note (Signed)
Father had CABG in his late 42s, early 31s.

## 2022-10-03 NOTE — Assessment & Plan Note (Signed)
Chest heaviness last for hours at a time.  This does not necessarily sound ischemic but with her family history and mild hyperlipidemia will get a coronary calcium score to further evaluate.

## 2022-10-09 DIAGNOSIS — G4733 Obstructive sleep apnea (adult) (pediatric): Secondary | ICD-10-CM

## 2022-10-09 DIAGNOSIS — E782 Mixed hyperlipidemia: Secondary | ICD-10-CM

## 2022-10-09 DIAGNOSIS — R002 Palpitations: Secondary | ICD-10-CM | POA: Diagnosis not present

## 2022-10-09 DIAGNOSIS — R0789 Other chest pain: Secondary | ICD-10-CM

## 2022-10-09 DIAGNOSIS — Z8249 Family history of ischemic heart disease and other diseases of the circulatory system: Secondary | ICD-10-CM | POA: Diagnosis not present

## 2022-10-12 ENCOUNTER — Encounter: Payer: Self-pay | Admitting: Cardiovascular Disease

## 2022-10-15 ENCOUNTER — Telehealth: Payer: Self-pay

## 2022-10-15 NOTE — Telephone Encounter (Signed)
**Note De-Identified Lisa Wilkins Obfuscation** I called [employer] [place] and did this [employer] PA over the phone with Lisa Wilkins who advised me that a PA is not required. Reference #: [id number]   I have notified the pt Lisa Wilkins her [facility] message from 7/12.

## 2022-10-22 ENCOUNTER — Encounter: Payer: Self-pay | Admitting: Cardiovascular Disease

## 2022-10-23 NOTE — Telephone Encounter (Signed)
Call to patient to let her know device has been initialized and she can proceed with WatchPat One sleep study.  Pt verbalized understanding of instructions as provided. Jim Like MHA RN CCM

## 2022-10-25 ENCOUNTER — Encounter (INDEPENDENT_AMBULATORY_CARE_PROVIDER_SITE_OTHER): Payer: BC Managed Care – PPO | Admitting: Cardiology

## 2022-10-25 DIAGNOSIS — G4733 Obstructive sleep apnea (adult) (pediatric): Secondary | ICD-10-CM

## 2022-10-26 ENCOUNTER — Ambulatory Visit: Payer: BC Managed Care – PPO | Attending: Cardiovascular Disease

## 2022-10-26 DIAGNOSIS — R0789 Other chest pain: Secondary | ICD-10-CM

## 2022-10-26 DIAGNOSIS — E782 Mixed hyperlipidemia: Secondary | ICD-10-CM

## 2022-10-26 DIAGNOSIS — R002 Palpitations: Secondary | ICD-10-CM

## 2022-10-26 DIAGNOSIS — Z8249 Family history of ischemic heart disease and other diseases of the circulatory system: Secondary | ICD-10-CM

## 2022-10-26 DIAGNOSIS — G4733 Obstructive sleep apnea (adult) (pediatric): Secondary | ICD-10-CM

## 2022-10-26 NOTE — Procedures (Signed)
Patient Information Study Date: 10/25/2022 Patient Name: Lisa Wilkins Patient ID: [id number] Birth Date: 01/07/2006 Age: 52 Gender: Female BMI: 29.2 (W=159 lb, H=5' 2'') Referring Physician: Nanetta Batty, MD  TEST DESCRIPTION:  Home sleep apnea testing was completed using the WatchPat, a Type 1 device, utilizing peripheral arterial tonometry (PAT), chest movement, actigraphy, pulse oximetry, pulse rate, body position and snore.  AHI was calculated with apnea and hypopnea using valid sleep time as the denominator. RDI includes apneas, hypopneas, and RERAs.  The data acquired and the scoring of sleep and all associated events were performed in accordance with the recommended standards and specifications as outlined in the AASM Manual for the Scoring of Sleep and Associated Events 2.2.0 (2015).  FINDINGS:  1.  Moderate Obstructive Sleep Apnea with AHI 21.3/hr.   2.  No Central Sleep Apnea with pAHIc 4.1/hr.  3.  Oxygen desaturations as low as 85%.  4.  Mild snoring was present. O2 sats were < 88% for 0.1 min.  5.  Total sleep time was 7 hrs and 54 min.  6.  30.9% of total sleep time was spent in REM sleep.   7.  Normal sleep onset latency at 25 min  8.  Shortened REM sleep onset latency at 73 min.   9.  Total awakenings were 5.  10. Arrhythmia detection:  None.  DIAGNOSIS:   Moderate Obstructive Sleep Apnea (G47.33)  RECOMMENDATIONS:   1.  Clinical correlation of these findings is necessary.  The decision to treat obstructive sleep apnea (OSA) is usually based on the presence of apnea symptoms or the presence of associated medical conditions such as Hypertension, Congestive Heart Failure, Atrial Fibrillation or Obesity.  The most common symptoms of OSA are snoring, gasping for breath while sleeping, daytime sleepiness and fatigue.   2.  Initiating apnea therapy is recommended given the presence of symptoms and/or associated conditions. Recommend proceeding with one of the  following:     a.  Auto-CPAP therapy with a pressure range of 5-20cm H2O.     b.  An oral appliance (OA) that can be obtained from certain dentists with expertise in sleep medicine.  These are primarily of use in non-obese patients with mild and moderate disease.     c.  An ENT consultation which may be useful to look for specific causes of obstruction and possible treatment options.     d.  If patient is intolerant to PAP therapy, consider referral to ENT for evaluation for hypoglossal nerve stimulator.   3.  Close follow-up is necessary to ensure success with CPAP or oral appliance therapy for maximum benefit.  4.  A follow-up oximetry study on CPAP is recommended to assess the adequacy of therapy and determine the need for supplemental oxygen or the potential need for Bi-level therapy.  An arterial blood gas to determine the adequacy of baseline ventilation and oxygenation should also be considered.  5.  Healthy sleep recommendations include:  adequate nightly sleep (normal 7-9 hrs/night), avoidance of caffeine after noon and alcohol near bedtime, and maintaining a sleep environment that is cool, dark and quiet.  6.  Weight loss for overweight patients is recommended.  Even modest amounts of weight loss can significantly improve the severity of sleep apnea.  7.  Snoring recommendations include:  weight loss where appropriate, side sleeping, and avoidance of alcohol before bed.  8.  Operation of motor vehicle should not be performed when sleepy.  Signature: Armanda Magic, MD; [facility]; Diplomat, American Board of  Sleep Medicine Electronically Signed: 10/26/2022 2:29:43 PM

## 2022-11-12 ENCOUNTER — Telehealth: Payer: Self-pay

## 2022-11-12 DIAGNOSIS — G4733 Obstructive sleep apnea (adult) (pediatric): Secondary | ICD-10-CM

## 2022-11-12 NOTE — Telephone Encounter (Signed)
Notified patient of sleep study results and recommendations. All questions (if any) were answered. Patient verbalized understanding. CPAP Titration ordered today, 11/12/22, for [facility].

## 2022-11-12 NOTE — Telephone Encounter (Signed)
-----   Message from Armanda Magic sent at 10/26/2022  2:30 PM EDT ----- Please let patient know that they have sleep apnea.  Recommend therapeutic CPAP titration for treatment of patient's sleep disordered breathing.  If unable to perform an in lab titration then initiate ResMed auto CPAP from 4 to 15cm H2O with heated humidity and mask of choice and overnight pulse ox on CPAP.

## 2022-11-22 ENCOUNTER — Encounter: Payer: Self-pay | Admitting: Cardiovascular Disease

## 2022-11-26 ENCOUNTER — Telehealth: Payer: Self-pay

## 2022-11-26 NOTE — Telephone Encounter (Signed)
**Note De-Identified Lisa Wilkins Obfuscation** Per Alcario Drought with [employer] [place], No PA required for a CPAP Titration. Reference#: [id number]   I have forwarded the pts CPAP Titration order to the sleep lab with a message stating that it is ready to schedule so they will contact the pt to schedule her test.

## 2022-11-28 ENCOUNTER — Ambulatory Visit (HOSPITAL_COMMUNITY)
Admission: RE | Admit: 2022-11-28 | Discharge: 2022-11-28 | Disposition: A | Discharge status: Home / Self Care | Payer: BC Managed Care – PPO | Source: Ambulatory Visit | Attending: Cardiovascular Disease | Admitting: Cardiovascular Disease

## 2022-11-28 ENCOUNTER — Ambulatory Visit (HOSPITAL_COMMUNITY)
Admission: RE | Admit: 2022-11-28 | Discharge: 2022-11-28 | Disposition: A | Discharge status: Home / Self Care | Payer: Self-pay | Source: Ambulatory Visit | Attending: Cardiovascular Disease | Admitting: Cardiovascular Disease

## 2022-11-28 DIAGNOSIS — R002 Palpitations: Secondary | ICD-10-CM | POA: Insufficient documentation

## 2022-11-28 DIAGNOSIS — R0789 Other chest pain: Secondary | ICD-10-CM | POA: Insufficient documentation

## 2022-11-28 DIAGNOSIS — Z8249 Family history of ischemic heart disease and other diseases of the circulatory system: Secondary | ICD-10-CM | POA: Insufficient documentation

## 2022-11-28 DIAGNOSIS — G4733 Obstructive sleep apnea (adult) (pediatric): Secondary | ICD-10-CM | POA: Insufficient documentation

## 2022-11-28 DIAGNOSIS — E782 Mixed hyperlipidemia: Secondary | ICD-10-CM | POA: Diagnosis present

## 2022-11-28 LAB — ECHOCARDIOGRAM COMPLETE
AR max vel: 2.6 cm2
AV Area VTI: 2.38 cm2
AV Area mean vel: 2.22 cm2
AV Mean grad: 3.7 mmHg
AV Peak grad: 7.7 mmHg
Ao pk vel: 1.39 m/s
Area-P 1/2: 3.99 cm2
S' Lateral: 3.1 cm

## 2022-11-28 NOTE — Progress Notes (Signed)
*  PRELIMINARY RESULTS* Echocardiogram 2D Echocardiogram has been performed.  Stacey Drain 11/28/2022, 10:47 AM

## 2022-12-04 ENCOUNTER — Encounter: Payer: Self-pay | Admitting: Cardiovascular Disease

## 2022-12-04 NOTE — Telephone Encounter (Signed)
error 

## 2022-12-13 ENCOUNTER — Other Ambulatory Visit: Payer: Self-pay

## 2022-12-13 ENCOUNTER — Other Ambulatory Visit (HOSPITAL_COMMUNITY): Payer: Self-pay

## 2022-12-13 DIAGNOSIS — R590 Localized enlarged lymph nodes: Secondary | ICD-10-CM

## 2022-12-25 ENCOUNTER — Ambulatory Visit (HOSPITAL_BASED_OUTPATIENT_CLINIC_OR_DEPARTMENT_OTHER): Payer: Medicare Other | Attending: Cardiology | Admitting: Cardiology

## 2022-12-25 VITALS — Ht 62.0 in | Wt 155.0 lb

## 2022-12-25 DIAGNOSIS — G4733 Obstructive sleep apnea (adult) (pediatric): Secondary | ICD-10-CM | POA: Diagnosis not present

## 2022-12-27 NOTE — Procedures (Signed)
Patient Name: Lisa Wilkins, Lisa Wilkins Date: 12/25/2022 Gender: Female D.O.B: [birth year] Age (years): 21 Referring Provider: Armanda Magic MD, ABSM Height (inches): 62 Interpreting Physician: Armanda Magic MD, ABSM Weight (lbs): 155 RPSGT: Ulyess Mort BMI: 28 MRN: [id number] Neck Size: 13.50  CLINICAL INFORMATION The patient is referred for a PAP titration to treat sleep apnea.  SLEEP STUDY TECHNIQUE As per the AASM Manual for the Scoring of Sleep and Associated Events v2.3 (April 2016) with a hypopnea requiring 4% desaturations.  The channels recorded and monitored were frontal, central and occipital EEG, electrooculogram (EOG), submentalis EMG (chin), nasal and oral airflow, thoracic and abdominal wall motion, anterior tibialis EMG, snore microphone, electrocardiogram, and pulse oximetry. Bilevel positive airway pressure (BPAP) was initiated at the beginning of the study and titrated to treat sleep-disordered breathing.  MEDICATIONS Medications self-administered by patient taken the night of the study : MAGNESIUM, SULINDAC  RESPIRATORY PARAMETERS Optimal CPAP Pressure (cm):13 AHI at Optimal Pressure (/hr) 3 Overall Minimal O2 (%):92.0  Minimal O2 at Optimal Pressure (%): 93.0  SLEEP ARCHITECTURE Start Time:10:12:27 PM  Stop Time:4:44:23 AM  Total Time (min):391.9  Total Sleep Time (min):275.5 Sleep Latency (min):28.4  Sleep Efficiency (%):70.3%  REM Latency (min):222.5  WASO (min):88.1 Stage N1 (%):6.7%  Stage N2 (%):77.7%  Stage N3 (%):1.3%  Stage R (%):14.3 Supine (%):25.46  Arousal Index (/hr):21.8   CARDIAC DATA The 2 lead EKG demonstrated sinus rhythm. The mean heart rate was 59.4 beats per minute. Other EKG findings include: None.  LEG MOVEMENT DATA The total Periodic Limb Movements of Sleep (PLMS) were 0. The PLMS index was 0.0. A PLMS index of <15 is considered normal in adults.  IMPRESSIONS - An optimal PAP pressure was selected for this patient (  13cm of water) - Central sleep apnea was not noted during this titration (CAI = 1.7/h). - Significant oxygen desaturations were not observed during this titration (min O2 = 92.0%). - The patient snored with soft snoring volume. - No cardiac abnormalities were observed during this study. - Clinically significant periodic limb movements were not noted during this study. Arousals associated with PLMs were rare.  DIAGNOSIS - Obstructive Sleep Apnea (G47.33)  RECOMMENDATIONS - Trial of ResMed CPAP therapy on 13cm H2O with a Medium size Resmed Full Face AirFit F10 mask and heated humidification. - Avoid alcohol, sedatives and other CNS depressants that may worsen sleep apnea and disrupt normal sleep architecture. - Sleep hygiene should be reviewed to assess factors that may improve sleep quality. - Weight management and regular exercise should be initiated or continued. - Return to Sleep Center for re-evaluation after 4 weeks of therapy  [Electronically signed] 12/27/2022 10:31 AM  Armanda Magic MD, ABSM Diplomate, American Board of Sleep Medicine

## 2022-12-28 ENCOUNTER — Telehealth: Payer: Self-pay

## 2022-12-28 NOTE — Telephone Encounter (Signed)
Notified patient of titration results and recommendations. All questions were answered and patient verbalized understanding. Order for CPAP and supplies sent to [facility] today 12/28/22.

## 2022-12-28 NOTE — Telephone Encounter (Signed)
-----   Message from Armanda Magic sent at 12/27/2022 10:33 AM EDT ----- Please let patient know that they had a successful PAP titration and let DME know that orders are in EPIC.  Please set up 6 week OV with me.

## 2023-01-04 ENCOUNTER — Telehealth: Payer: Self-pay | Admitting: Cardiovascular Disease

## 2023-01-04 ENCOUNTER — Ambulatory Visit (HOSPITAL_COMMUNITY)
Admission: RE | Admit: 2023-01-04 | Discharge: 2023-01-04 | Disposition: A | Discharge status: Home / Self Care | Payer: BC Managed Care – PPO | Source: Ambulatory Visit | Attending: Cardiovascular Disease | Admitting: Cardiovascular Disease

## 2023-01-04 DIAGNOSIS — R590 Localized enlarged lymph nodes: Secondary | ICD-10-CM | POA: Insufficient documentation

## 2023-01-04 MED ORDER — IOHEXOL 300 MG/ML  SOLN
75.0000 mL | Freq: Once | INTRAMUSCULAR | Status: AC | PRN
  Administered 2023-01-04: 75 mL via INTRAVENOUS

## 2023-01-04 NOTE — Telephone Encounter (Signed)
Cathy with [facility] states patient is there now but they do not have any orders. Patient needs lab work for CT. Previous order was cancelled and I am not able to reinstate it. Please assist.

## 2023-01-04 NOTE — Telephone Encounter (Signed)
Spoke with patient and she stated when she was told to repeat CT she was informed she will need labs and that [facility] was across the street from where she is have CT done.   There are no orders for lab and I saw message but none states to have labs drawn. Does she ned labs? She did have CT this morning

## 2023-01-10 NOTE — Telephone Encounter (Signed)
Nothing further needed at this time. Pt was able to have CTA without lab work. Will follow up once CTA chest has been read.

## 2023-01-21 ENCOUNTER — Encounter: Payer: Self-pay | Admitting: Cardiovascular Disease

## 2023-01-21 ENCOUNTER — Telehealth: Payer: Self-pay

## 2023-01-21 ENCOUNTER — Ambulatory Visit: Payer: BC Managed Care – PPO | Attending: Cardiovascular Disease | Admitting: Cardiovascular Disease

## 2023-01-21 VITALS — BP 124/70 | HR 76 | Ht 62.0 in | Wt 153.0 lb

## 2023-01-21 DIAGNOSIS — G4733 Obstructive sleep apnea (adult) (pediatric): Secondary | ICD-10-CM | POA: Diagnosis not present

## 2023-01-21 DIAGNOSIS — R002 Palpitations: Secondary | ICD-10-CM | POA: Diagnosis not present

## 2023-01-21 DIAGNOSIS — Z0000 Encounter for general adult medical examination without abnormal findings: Secondary | ICD-10-CM

## 2023-01-21 DIAGNOSIS — Z720 Tobacco use: Secondary | ICD-10-CM

## 2023-01-21 DIAGNOSIS — R0789 Other chest pain: Secondary | ICD-10-CM | POA: Diagnosis not present

## 2023-01-21 DIAGNOSIS — E782 Mixed hyperlipidemia: Secondary | ICD-10-CM | POA: Diagnosis not present

## 2023-01-21 NOTE — Assessment & Plan Note (Signed)
Recent diagnosis of obstructive sleep apnea by Dr. Mayford Knife with prescription for CPAP.

## 2023-01-21 NOTE — Assessment & Plan Note (Signed)
History of hyperlipidemia not on statin therapy followed by her PCP 

## 2023-01-21 NOTE — Assessment & Plan Note (Signed)
History of atypical chest pain with a coronary calcium score of 0 normal 2D echo.

## 2023-01-21 NOTE — Patient Instructions (Addendum)
Medication Instructions:  Your physician recommends that you continue on your current medications as directed. Please refer to the Current Medication list given to you today.  *If you need a refill on your cardiac medications before your next appointment, please call your pharmacy*    Follow-Up: At [facility], you and your health needs are our priority.  As part of our continuing mission to provide you with exceptional heart care, we have created designated Provider Care Teams.  These Care Teams include your primary Cardiologist (physician) and Advanced Practice Providers (APPs -  Physician Assistants and Nurse Practitioners) who all work together to provide you with the care you need, when you need it.  We recommend signing up for the patient portal called "MyChart".  Sign up information is provided on this After Visit Summary.  MyChart is used to connect with patients for Virtual Visits (Telemedicine).  Patients are able to view lab/test results, encounter notes, upcoming appointments, etc.  Non-urgent messages can be sent to your provider as well.   To learn more about what you can do with MyChart, go to [email or web address].    Your next appointment:   12 month(s)  Provider:   Nanetta Batty, MD    Other Instructions Dr. Allyson Sabal has referred you to our life coach Amy Nedra Hai to work on smoking cessation. She will be reaching out to you in the next few days.

## 2023-01-21 NOTE — Progress Notes (Signed)
01/21/2023 Lisa Wilkins   14-Jun-1970  [id number]  Primary Physician Lavada Mesi, MD Primary Cardiologist: Runell Gess MD Nicholes Calamity, [place]  HPI:  Lisa Wilkins is a 52 y.o.  moderately overweight married Caucasian female mother of 3, grandmother of 7 grandchildren referred by Dr. Prince Rome for evaluation of palpitations.  I last saw her in the office 10/03/2022.  She is accompanied by her husband Lisa Wilkins today.  Patient she has been disabled for years because of rheumatoid arthritis since her 64s. Risk factors otherwise include 35-pack-year tobacco abuse recalcitrant to risk factor modification and untreated mild hyperlipidemia. Her father did have bypass surgery in his late 50s/early 47s. She has never had a heart attack or stroke. She has had hypothyroidism on replacement therapy since her 82s. She was put on additional Synthroid replacement several years ago and developed tachypalpitations which did not resolve when her dose was adjusted. She also drinks at least 2 cups of coffee a day, some tea and soft drinks which were discontinued. She has palpitations several times a week lasting 5 minutes at a time as well as atypical chest pain characterized as chest heaviness that last for hours at a time. She was also diagnosed with obstructive sleep apnea in the past.  She had a sleep study which was interpreted by Dr. Mayford Knife consistent with sleep apnea and CPAP was prescribed.  Since I saw her 3 months ago she did have a 2-week Zio patch that showed no arrhythmias.  A 2D echocardiogram performed 11/28/2022 was completely unremarkable and a coronary calcium score was 0 although they did see an anterior mediastinal fullness which a follow-up CT scan performed 01/15/2023 showed no difference from 1 dating back in 2007 suggesting benign thymic hyperplasia.  There is no suspicious mediastinal adenopathy.  Current Meds  Medication Sig   acetaminophen (TYLENOL) 650 MG CR tablet Take  1,300 mg by mouth every 8 (eight) hours as needed for pain.   albuterol (PROVENTIL HFA;VENTOLIN HFA) 108 (90 Base) MCG/ACT inhaler Inhale 1-2 puffs into the lungs every 6 (six) hours as needed for wheezing or shortness of breath.   sulindac (CLINORIL) 200 MG tablet    SYNTHROID 137 MCG tablet Take 137 mcg by mouth every morning.     Allergies  Allergen Reactions   Elemental Sulfur Nausea Only   Remicade [Infliximab] Hives   Tramadol Anxiety    Social History   Socioeconomic History   Marital status: Married    Spouse name: Not on file   Number of children: Not on file   Years of education: Not on file   Highest education level: Not on file  Occupational History   Not on file  Tobacco Use   Smoking status: Heavy Smoker    Current packs/day: 1.00    Average packs/day: 1 pack/day for 20.0 years (20.0 ttl pk-yrs)    Types: Cigarettes   Smokeless tobacco: Never  Vaping Use   Vaping status: Never Used  Substance and Sexual Activity   Alcohol use: No   Drug use: No   Sexual activity: Yes    Birth control/protection: Surgical  Other Topics Concern   Not on file  Social History Narrative   Not on file   Social Determinants of Health   Financial Resource Strain: Not on file  Food Insecurity: Not on file  Transportation Needs: Not on file  Physical Activity: Not on file  Stress: Not on file (05/12/2019)  Social Connections: Not on file  Intimate Partner Violence: Low Risk  (07/17/2019)   Received from [facility]   Intimate Partner Violence    Insults You: Not on file    Threatens You: Not on file    Screams at You: Not on file    Physically Hurt: Not on file    Intimate Partner Violence Score: Not on file     Review of Systems: General: negative for chills, fever, night sweats or weight changes.  Cardiovascular: negative for chest pain, dyspnea on exertion, edema, orthopnea, palpitations, paroxysmal nocturnal dyspnea or shortness of breath Dermatological: negative  for rash Respiratory: negative for cough or wheezing Urologic: negative for hematuria Abdominal: negative for nausea, vomiting, diarrhea, bright red blood per rectum, melena, or hematemesis Neurologic: negative for visual changes, syncope, or dizziness All other systems reviewed and are otherwise negative except as noted above.    Blood pressure 124/70, pulse 76, height 5\' 2"  (1.575 m), weight 153 lb (69.4 kg).  General appearance: alert and no distress Neck: no adenopathy, no carotid bruit, no JVD, supple, symmetrical, trachea midline, and thyroid not enlarged, symmetric, no tenderness/mass/nodules Lungs: clear to auscultation bilaterally Heart: regular rate and rhythm, S1, S2 normal, no murmur, click, rub or gallop Extremities: extremities normal, atraumatic, no cyanosis or edema Pulses: 2+ and symmetric Skin: Skin color, texture, turgor normal. No rashes or lesions Neurologic: Grossly normal  EKG not performed today      ASSESSMENT AND PLAN:   Hyperlipidemia History of hyperlipidemia not on statin therapy followed by her PCP  Atypical chest pain History of atypical chest pain with a coronary calcium score of 0 normal 2D echo.  Palpitations History of palpitations with a 2-week Zio patch showed no arrhythmias.  She has somewhat cut down her caffeine.  She has also been diagnosed with menopause.  She was also recently diagnosed with obstructive sleep apnea and has been prescribed CPAP which she just picked up yesterday.  Her palpitations are fairly infrequent.  Obstructive sleep apnea Recent diagnosis of obstructive sleep apnea by Dr. Mayford Knife with prescription for CPAP.     Runell Gess MD FACP,FACC,FAHA, [facility] 01/21/2023 11:46 AM

## 2023-01-21 NOTE — Telephone Encounter (Signed)
Called patient per health coaching referral for smoking cessation. Patient stated that she is interested in participating in the program. Patient has been scheduled for her initial health coaching session on 10/28 at 9:00am. Patient will be called at this time.   Renaee Munda, MS, ERHD, [facility]  Care Guide, Health & Wellness Coach [address], [address] [place] [address] Telephone: [phone] Email: [email or web address]

## 2023-01-21 NOTE — Assessment & Plan Note (Signed)
History of palpitations with a 2-week Zio patch showed no arrhythmias.  She has somewhat cut down her caffeine.  She has also been diagnosed with menopause.  She was also recently diagnosed with obstructive sleep apnea and has been prescribed CPAP which she just picked up yesterday.  Her palpitations are fairly infrequent.

## 2023-01-28 ENCOUNTER — Ambulatory Visit: Payer: BC Managed Care – PPO | Attending: Cardiovascular Disease

## 2023-01-28 DIAGNOSIS — Z0000 Encounter for general adult medical examination without abnormal findings: Secondary | ICD-10-CM

## 2023-01-28 NOTE — Progress Notes (Signed)
HEALTH & WELLNESS COACHING INITIAL INTAKE   Appointment Outcome: Completed, Session #: Initial                        Start time: 9:03am   End time: 9:52am  Total Mins: 49 minutes   What are the Patient's goals from Coaching?  Patient desires to quit smoking.    Why did they seek coaching now?  Patient has been able to make dietary changes to manage rheumatoid arthritis and is now seeking support to quit smoking to improve overall health.    Readiness - What stage is the patient in regarding their goal(s)? Patient is in the preparation stage of smoking cessation.    Coaching Progress Notes:  Patient stated that she has been smoking since she was 13 (approximately 40 Years). Patient reported that she currently smokes on average one pack of cigarettes per day. Patient stated that she is aware of the risks associated with smoking and it does not align with the new health behaviors that she is implementing with her diet. Patient feels that smoking is one area that has been challenging for her to make a change.   Patient explained that she has attempted to quit previously using Wellbutrin, but her smoking increased. Patient shared that she has also quit cold [place] and was able to go without smoking for three days but relapsed due to withdrawal. Patient mentioned that she has also tried to reduce her smoking by extending the time in between cigarettes and found herself thinking about smoking more, which became irritating.   Patient stated that her smoking is associated with being bored, wanting to take a break, and part of her daily routine. Patient shared that her strongest urge occurs in the morning when she smokes the most. Patient stated that over the course of the day her smoking tapers down. Patient mentioned that she smokes outside her home and prefers to not smoke in the car but finds herself smoking when she goes somewhere after a certain duration of time. Patient stated that after  eating and when talking on the phone are other times that she smokes.   Patient is concerned about the habits of smoking because she feels that her smoking is automated. Patient stated that sometimes she finds herself smoking every 15 minutes depending on the day, which may be associated with experiencing pain. Patient stated that on a good day she will smoke less than one pack of cigarettes and on other days she has smoked up to 1.5 packs of cigarettes. Patient reported that her husband smokes less cigarettes, but also vape. Patient mentioned that she is not interested in vaping.    Coaching Outcomes Discussed with patient what approach she wanted to take towards achieving smoking cessation. Patient expressed that she does not want to take medication due to a previous experience. Patient shared that she cannot chew gum but is not opposed to the nicotine lozenges and patches.   Discussed with patient the use of nicotine patches and how lozenges can be used in conjunction with one another for NRT. Determined that patient is interested in using this approach and that the lozenges would be an emergency aid when her urge to smoke is intense to help deter her from smoking.  Discussed with patient how her husband's smoking may be a challenge to her goal of smoking cessation. Patient expressed that he doesn't smoke cigarettes in the house but will vape. Patient shared that believes she  will be able to manage having access to cigarettes but not smoking once she starts wearing the patch.   Discussed with patient the strategy of deep breathing that may be helpful during intense moments of craving a cigarette if she uses a lozenge at the time. Patient stated that she does practice deep breathing for lung health and is interested in intentionally engaging in this behavior when she has intense cravings.     AGREEMENTS SECTION   Overall Goal(s): Smoking Cessation (Quit Date TBD when patches are started)                                                 Agreement/Action Steps:  Wear nicotine patches as prescribed Use a lozenge when nicotine craving is intense (e.g., in the morning, after eating, driving, bored). Practice deep breathing when urge to smoke occurs   Agreement Signed & Returned? Reviewed Coaching Agreement and Code of Ethics with Patient during initial session. Answered any questions the patient had if any regarding the Coaching Agreement and Code of Ethics. Patient verbally agreed to adhere to the Coaching Agreement and to abide by the Code of Ethics.  Mailed patient with a hard/electronic copy of the Coaching Agreement and Code of Ethics.   Resources: Sent staff message to Dr. Allyson Sabal and Elicia Lamp regarding patient's request for a prescription for nicotine patches and lozenges. Patient was emailed a copy of her action steps and deep breathing exercises.

## 2023-01-30 ENCOUNTER — Other Ambulatory Visit: Payer: Self-pay

## 2023-01-30 MED ORDER — NICOTINE 21 MG/24HR TD PT24: 21.0000 mg | MEDICATED_PATCH | Freq: Every day | TRANSDERMAL | 2 refills | Status: DC

## 2023-01-30 MED ORDER — NICOTINE POLACRILEX 4 MG MT LOZG: 4.0000 mg | LOZENGE | OROMUCOSAL | 3 refills | Status: DC | PRN

## 2023-01-30 NOTE — Progress Notes (Signed)
Message from [employer], Renaee Munda regarding pt interested in using nicotine lozenges and patches to help with smoking cessation. Ok to send per Dr. Allyson Sabal. 4mg  nicotine lozenges and 21mg  patches sent to pt's preferred pharmacy. She understands how to use. Pt will follow up with Amy or myself when she needs lower strengths of lozenges and/or patches. We did discuss other resources for obtaining products to help with smoking cessation. Pt verbalizes understanding.

## 2023-02-11 ENCOUNTER — Telehealth: Payer: Self-pay

## 2023-02-11 ENCOUNTER — Ambulatory Visit: Payer: BC Managed Care – PPO

## 2023-02-11 DIAGNOSIS — Z0000 Encounter for general adult medical examination without abnormal findings: Secondary | ICD-10-CM

## 2023-02-11 NOTE — Progress Notes (Signed)
Patient sent the following email to reschedule today's health coaching appointment:  I had a bad night and just woke up . We can reschedule for later today or tomorrow. Sent from my iPhone  Responded to patient with availability for today and 11/12.  Patient replied with the following email: Can we do tomorrow at 9 .  Sent from my iPhone   Patient has been rescheduled accordingly and will be called at that time.    Renaee Munda, MS, ERHD, [facility]  Care Guide, Health & Wellness Coach [address] [place] [address] Telephone: [phone] Email: [email or web address]

## 2023-02-11 NOTE — Telephone Encounter (Signed)
Called patient per scheduled health coaching session. Patient did not answer. Left message for patient to return call to hold session or reschedule if necessary.   Renaee Munda, MS, ERHD, [facility]  Care Guide, Health & Wellness Coach [address], [address] [place] [address] Telephone: [phone] Email: [email or web address]

## 2023-02-12 ENCOUNTER — Ambulatory Visit: Payer: BC Managed Care – PPO | Attending: Cardiology

## 2023-02-12 DIAGNOSIS — Z0000 Encounter for general adult medical examination without abnormal findings: Secondary | ICD-10-CM

## 2023-02-12 NOTE — Progress Notes (Signed)
Appointment Outcome: Completed, Session #: 1                        Start time: 9:01am   End time: 9:23am   Total Mins: 22 minutes  AGREEMENTS SECTION   Overall Goal(s): Smoking Cessation (Quit Date TBD when patches are started)                                                 Agreement/Action Steps:  Wear nicotine patches as prescribed Use a lozenge when nicotine craving is intense (e.g., in the morning, after eating, driving, bored). Practice deep breathing when urge to smoke occurs    Progress Notes:  Patient reported that she had trouble with trying to wear the patch on February 02, 2023 due to medication anxiety. Patient shared that she put the patch on but was not able to wear it long.   Patient mentioned that she thinks she will be able to use the lozenges because she mentally views them differently from the patches. Patient stated that she will have to use small pieces of the lozenges to deter smoking instead of a whole piece because it made her feel sick.   Patient expressed that she was having additional issues that was contributing to her smoking such as physical pain and not being able to sleep well. Patient shared that she does wake up and smoke at night because of these issues, but do not smoke an entire cigarette. Patient mentioned that her urge to smoke is the strongest in the morning.   Patient stated that her goal is to be a non-smoker by the beginning of 2025. Patient explained that her plan is to cut back on smoking over time by using the lozenges. Patient shared that using the lozenges has helped her not to smoke an entire pack of cigarettes per day. Patient mentioned that she wants to use a lozenge starting off every other time she has an urge to smoke instead of smoking outside. Patient also mentioned that she will not take her cigarettes with her when she leaves the house.    Indicators of Success and Accountability:  Patient was able to use lozenges successfully to  reduce smoking an entire pack of cigarettes daily over the past two weeks.   Readiness: Patient is in the action stage of smoking cessation.   Strengths and Supports: Patient is relying on self-awareness.   Challenges and Barriers: Patient is dealing with trouble sleeping, pain, and becomes anxious which triggers her to smoke.    Coaching Outcomes: Patient stated that she will focus on making herself more aware of how many cigarettes she smokes daily and when. Patient also stated that she will work on distracting herself from not thinking about smoking with staying busy with housework and other tasks.     Overall Goal(s): Smoking Cessation (Quit Date TBD when patches are started)                                                 Agreement/Action Steps:  Alternate times using a lozenge when nicotine craving is intense (e.g., in the morning, after eating, driving, bored). Practice deep breathing when urge to smoke  occurs Engage in household chores and other tasks as distractions Be mindful of how many cigarettes smoked and when daily     Attempted: Fulfilled - Patient used lozenges to deter smoking at various times. Patient practiced deep breathing.  Partial - Patient was able to wear nicotine patch for a partial day.

## 2023-02-15 ENCOUNTER — Encounter: Payer: Self-pay | Admitting: Cardiovascular Disease

## 2023-02-21 ENCOUNTER — Telehealth: Payer: Self-pay

## 2023-02-21 DIAGNOSIS — Z0000 Encounter for general adult medical examination without abnormal findings: Secondary | ICD-10-CM

## 2023-02-21 NOTE — Telephone Encounter (Signed)
Called patient to reschedule telephonic health coaching appointment due to being out of the office on 11/25. Patient verbally expressed understanding and has been rescheduled for 12/2 at 9:00am. Patient will be called at this time.   Renaee Munda, MS, ERHD, [facility]  Care Guide, Health & Wellness Coach [address] [place] [address] Telephone: [phone] Email: [email or web address]

## 2023-02-25 ENCOUNTER — Ambulatory Visit: Payer: Self-pay

## 2023-02-28 ENCOUNTER — Emergency Department (HOSPITAL_COMMUNITY)
Admission: EM | Admit: 2023-02-28 | Discharge: 2023-02-28 | Disposition: A | Discharge status: Home / Self Care | Payer: BC Managed Care – PPO | Attending: Emergency Medicine | Admitting: Emergency Medicine

## 2023-02-28 ENCOUNTER — Encounter (HOSPITAL_COMMUNITY): Payer: Self-pay

## 2023-02-28 ENCOUNTER — Emergency Department (HOSPITAL_COMMUNITY): Payer: BC Managed Care – PPO

## 2023-02-28 ENCOUNTER — Other Ambulatory Visit: Payer: Self-pay

## 2023-02-28 DIAGNOSIS — R1031 Right lower quadrant pain: Secondary | ICD-10-CM | POA: Diagnosis present

## 2023-02-28 DIAGNOSIS — R110 Nausea: Secondary | ICD-10-CM | POA: Insufficient documentation

## 2023-02-28 LAB — CBC WITH DIFFERENTIAL/PLATELET
Abs Immature Granulocytes: 0.01 10*3/uL (ref 0.00–0.07)
Basophils Absolute: 0.1 10*3/uL (ref 0.0–0.1)
Basophils Relative: 1 %
Eosinophils Absolute: 0.3 10*3/uL (ref 0.0–0.5)
Eosinophils Relative: 4 %
HCT: 36.8 % (ref 36.0–46.0)
Hemoglobin: 12.1 g/dL (ref 12.0–15.0)
Immature Granulocytes: 0 %
Lymphocytes Relative: 27 %
Lymphs Abs: 1.9 10*3/uL (ref 0.7–4.0)
MCH: 28.5 pg (ref 26.0–34.0)
MCHC: 32.9 g/dL (ref 30.0–36.0)
MCV: 86.8 fL (ref 80.0–100.0)
Monocytes Absolute: 0.5 10*3/uL (ref 0.1–1.0)
Monocytes Relative: 8 %
Neutro Abs: 4.2 10*3/uL (ref 1.7–7.7)
Neutrophils Relative %: 60 %
Platelets: 256 10*3/uL (ref 150–400)
RBC: 4.24 MIL/uL (ref 3.87–5.11)
RDW: 15.8 % — ABNORMAL HIGH (ref 11.5–15.5)
WBC: 6.9 10*3/uL (ref 4.0–10.5)
nRBC: 0 % (ref 0.0–0.2)

## 2023-02-28 LAB — URINALYSIS, ROUTINE W REFLEX MICROSCOPIC
Bilirubin Urine: NEGATIVE
Glucose, UA: NEGATIVE mg/dL
Hgb urine dipstick: NEGATIVE
Ketones, ur: NEGATIVE mg/dL
Leukocytes,Ua: NEGATIVE
Nitrite: NEGATIVE
Protein, ur: NEGATIVE mg/dL
Specific Gravity, Urine: 1.008 (ref 1.005–1.030)
pH: 8 (ref 5.0–8.0)

## 2023-02-28 LAB — COMPREHENSIVE METABOLIC PANEL
ALT: 9 U/L (ref 0–44)
AST: 17 U/L (ref 15–41)
Albumin: 3.8 g/dL (ref 3.5–5.0)
Alkaline Phosphatase: 152 U/L — ABNORMAL HIGH (ref 38–126)
Anion gap: 9 (ref 5–15)
BUN: 14 mg/dL (ref 6–20)
CO2: 23 mmol/L (ref 22–32)
Calcium: 9.6 mg/dL (ref 8.9–10.3)
Chloride: 105 mmol/L (ref 98–111)
Creatinine, Ser: 0.84 mg/dL (ref 0.44–1.00)
GFR, Estimated: >60 mL/min (ref >60)
Glucose, Bld: 95 mg/dL (ref 70–99)
Potassium: 4.2 mmol/L (ref 3.5–5.1)
Sodium: 137 mmol/L (ref 135–145)
Total Bilirubin: 0.3 mg/dL (ref <1.2)
Total Protein: 7.1 g/dL (ref 6.5–8.1)

## 2023-02-28 LAB — LIPASE, BLOOD: Lipase: 42 U/L (ref 11–51)

## 2023-02-28 LAB — SEDIMENTATION RATE: Sed Rate: 26 mm/h — ABNORMAL HIGH (ref 0–22)

## 2023-02-28 MED ORDER — MORPHINE SULFATE 15 MG PO TABS
15.0000 mg | ORAL_TABLET | Freq: Once | ORAL | Status: AC
  Administered 2023-02-28: 15 mg via ORAL
  Filled 2023-02-28: qty 1

## 2023-02-28 MED ORDER — IOHEXOL 300 MG/ML  SOLN
100.0000 mL | Freq: Once | INTRAMUSCULAR | Status: AC | PRN
  Administered 2023-02-28: 100 mL via INTRAVENOUS

## 2023-02-28 MED ORDER — PREDNISONE 50 MG PO TABS
50.0000 mg | ORAL_TABLET | Freq: Once | ORAL | Status: AC
  Administered 2023-02-28: 50 mg via ORAL
  Filled 2023-02-28: qty 1

## 2023-02-28 NOTE — ED Notes (Signed)
Patient transported to CT 

## 2023-02-28 NOTE — Discharge Instructions (Addendum)
You were seen in the emerged department for right lower quadrant abdominal pain The CAT scan taken did not show any appendicitis but did show some inflammation on the right side of your colon and slightly enlarged diameter of one of the docs that was connected to your gallbladder One of your labs (alk phos) is slightly elevated but the rest of your liver function and other blood test look okay We are not certain what is causing your abdominal pain but do not think you need any more testing or need to be admitted tonight Take Tylenol as directed for abdominal pain and follow-up with your primary care doctor within next week for reevaluation and to have repeat blood work taken As discussed you should come back to the emergency department for severe abdominal pain, if you are unable to eat or drink or have any other concerns

## 2023-02-28 NOTE — ED Provider Notes (Signed)
[facility] AT [facility] Provider Note   CSN: [id number] Arrival date & time: 02/28/23  1754     History  Chief Complaint  Patient presents with   Abdominal Pain    Lisa Wilkins is a 52 y.o. female.  With a past history of status post cholecystectomy and status post hysterectomy who presents to the ED for abdominal pain.  She first experienced right lower quadrant abdominal pain early this morning that has persisted into the onset.  Pain is sharp and stabbing.  Some associated nausea but no vomiting or change in bowel habits.  Denies fevers chills.  She did have a similar episode of right lower quadrant abdominal pain few weeks ago that resolved spontaneously and she did not seek medical attention at that time.   Abdominal Pain      Home Medications Prior to Admission medications   Medication Sig Start Date End Date Taking? Authorizing Provider  SYNTHROID 175 MCG tablet Take 175 mcg by mouth daily. 02/18/23  Yes [provider]  acetaminophen (TYLENOL) 650 MG CR tablet Take 1,300 mg by mouth every 8 (eight) hours as needed for pain.    [provider]  albuterol (PROVENTIL HFA;VENTOLIN HFA) 108 (90 Base) MCG/ACT inhaler Inhale 1-2 puffs into the lungs every 6 (six) hours as needed for wheezing or shortness of breath. 07/09/16   Payton Mccallum, MD  nicotine (NICODERM CQ - DOSED IN MG/24 HOURS) 21 mg/24hr patch Place 1 patch (21 mg total) onto the skin daily. 01/30/23   Runell Gess, MD  nicotine polacrilex (COMMIT) 4 MG lozenge Take 1 lozenge (4 mg total) by mouth as needed for smoking cessation. 01/30/23   Runell Gess, MD  sulindac (CLINORIL) 200 MG tablet  01/09/18   [provider]      Allergies    Elemental sulfur, Remicade [infliximab], and Tramadol    Review of Systems   Review of Systems  Gastrointestinal:  Positive for abdominal pain.    Physical Exam Updated Vital Signs BP 113/72 (BP  Location: Right Arm)   Pulse 71   Temp 98.9 F (37.2 C) (Oral)   Resp 15   Ht 5\' 2"  (1.575 m)   Wt 70.3 kg   SpO2 96%   BMI 28.35 kg/m  Physical Exam Vitals and nursing note reviewed.  HENT:     Head: Normocephalic and atraumatic.  Eyes:     Pupils: Pupils are equal, round, and reactive to light.  Cardiovascular:     Rate and Rhythm: Normal rate and regular rhythm.  Pulmonary:     Effort: Pulmonary effort is normal.     Breath sounds: Normal breath sounds.  Abdominal:     Palpations: Abdomen is soft.     Tenderness: There is abdominal tenderness in the right lower quadrant. Positive signs include Rovsing's sign and McBurney's sign.  Skin:    General: Skin is warm and dry.  Neurological:     Mental Status: She is alert.  Psychiatric:        Mood and Affect: Mood normal.     ED Results / Procedures / Treatments   Labs (all labs ordered are listed, but only abnormal results are displayed) Labs Reviewed  COMPREHENSIVE METABOLIC PANEL - Abnormal; Notable for the following components:      Result Value   Alkaline Phosphatase 152 (*)    All other components within normal limits  CBC WITH DIFFERENTIAL/PLATELET - Abnormal; Notable for the following components:  RDW 15.8 (*)    All other components within normal limits  URINALYSIS, ROUTINE W REFLEX MICROSCOPIC - Abnormal; Notable for the following components:   Color, Urine STRAW (*)    All other components within normal limits  LIPASE, BLOOD  SEDIMENTATION RATE    EKG None  Radiology CT ABDOMEN PELVIS W CONTRAST  Result Date: 02/28/2023 CLINICAL DATA:  Right lower quadrant abdominal pain, worsening throughout the day EXAM: CT ABDOMEN AND PELVIS WITH CONTRAST TECHNIQUE: Multidetector CT imaging of the abdomen and pelvis was performed using the standard protocol following bolus administration of intravenous contrast. RADIATION DOSE REDUCTION: This exam was performed according to the departmental dose-optimization  program which includes automated exposure control, adjustment of the mA and/or kV according to patient size and/or use of iterative reconstruction technique. CONTRAST:  OMNIPAQUE IOHEXOL 300 MG/ML  SOLN COMPARISON:  Abdominal ultrasound 08/09/2022 and PET-CT 12/07/2005 FINDINGS: Lower chest: Unremarkable Hepatobiliary: Mild intrahepatic biliary dilatation with the common bile duct about 0.8 cm in diameter. Prior cholecystectomy. The mild biliary dilatation may simply be a physiologic response to cholecystectomy, correlate with any significant elevation of liver enzyme levels or bilirubin levels. Pancreas: Unremarkable Spleen: Unremarkable Adrenals/Urinary Tract: Unremarkable Stomach/Bowel: Mobile cecum extends into the left lower quadrant. Normal appendix. Subtle stranding along the wall of the ascending colon in the right abdomen on images 48 through 59 of series 2, cannot exclude low-grade proximal colitis. Vascular/Lymphatic: Atherosclerosis is present, including aortoiliac atherosclerotic disease. Faint retroperitoneal stranding particularly in the periaortic region for example on images 37 through 48 of series 2, query low-grade retroperitoneal fibrosis. Reproductive: Uterus absent.  Adnexa unremarkable. Other: No supplemental non-categorized findings. Musculoskeletal: Potential right foraminal impingement at L3-4 due to disc protrusion. IMPRESSION: 1. Subtle stranding along the wall of the ascending colon in the right abdomen, cannot exclude low-grade proximal colitis. 2. Normal appendix. 3. Faint retroperitoneal stranding particularly in the periaortic region, query low-grade retroperitoneal fibrosis. 4. Suspected right foraminal impingement at L3-4 due to disc protrusion. 5. Mild intrahepatic biliary dilatation with the common bile duct about 0.8 cm in diameter. Prior cholecystectomy. The mild biliary dilatation may simply be a physiologic response to cholecystectomy, correlate with any significant  elevation of liver enzyme levels or bilirubin levels. 6. Aortic atherosclerosis. Aortic Atherosclerosis (ICD10-I70.0). Electronically Signed   By: Gaylyn Rong M.D.   On: 02/28/2023 19:59    Procedures Procedures    Medications Ordered in ED Medications  predniSONE (DELTASONE) tablet 50 mg (has no administration in time range)  morphine (MSIR) tablet 15 mg (has no administration in time range)  iohexol (OMNIPAQUE) 300 MG/ML solution 100 mL (100 mLs Intravenous Contrast Given 02/28/23 1930)    ED Course/ Medical Decision Making/ A&P Clinical Course as of 02/28/23 2041  Thu Feb 28, 2023  2011 UA negative.  No significant leukocytosis, elevation in lipase.  No significant electrolyte imbalance.  CMP notable for elevated alk phos of 152 but no other elevation in her LFTs or bilirubin.  CT abdomen pelvis shows no acute appendicitis.  Suggestion of mild right ascending colitis.  Mild intrahepatic biliary dilation likely in the setting of status post cholecystectomy but unlikely to represent the cause of her presentation here tonight in light of her predominantly normal LFTs  Informed patient and her husband at bedside of these findings.  No clear cause of her presentation tonight but it may be related to the colitis seen on CT.  She suggests it may be related to her dietary changes and increased sugar  intake over the last week causing some inflammation which is not unlikely.  She feels comfortable now will follow-up with her PCP to have repeat labs drawn and understands that she should come back for severe abdominal pain, vomiting or any other concerns [MP]  2037 Patient now requesting 1 dose of oral pain medicine here that would be stronger than Tylenol before she goes home along with prednisone to help with any inflammatory cause of her pain.  I do feel like this is reasonable request.  Will give her dose of oral morphine and a dose of prednisone while she is here and observe her for a little  while longer [MP]    Clinical Course User Index [MP] Royanne Foots, DO                                 Medical Decision Making 52 year old female with history as above presenting for acute right lower quadrant pain that began today.  Associated nausea.  Afebrile slightly hypertensive on exam.  Exam notable for right lower quadrant tenderness with positive McBurney's and Rovsing.  Presentation most concerning for acute appendicitis.  Will obtain laboratory workup and urinalysis along with CT abdomen pelvis with IV contrast.  Other differentials to consider would be UTI, diverticulitis, ovarian cyst or viral gastroenteritis.  Patient has declined offer of pain medication at this time but will reassess while she awaits her workup  Amount and/or Complexity of Data Reviewed Labs: ordered. Radiology: ordered.  Risk Prescription drug management.           Final Clinical Impression(s) / ED Diagnoses Final diagnoses:  Right lower quadrant abdominal pain    Rx / DC Orders ED Discharge Orders     None         Royanne Foots, DO 02/28/23 2041

## 2023-02-28 NOTE — ED Triage Notes (Signed)
Pt to er, pt states that she is here for RLQ pain, states that the pain started this am and has gotten worse throughout the day, states that it is constant with some sharp stabbing pains.  Denies urinary symptoms, states that she is also having some back pain.

## 2023-03-04 ENCOUNTER — Ambulatory Visit: Payer: BC Managed Care – PPO | Attending: Cardiology

## 2023-03-04 DIAGNOSIS — Z0000 Encounter for general adult medical examination without abnormal findings: Secondary | ICD-10-CM

## 2023-03-04 NOTE — Progress Notes (Unsigned)
Appointment Outcome: Completed, Session #: 2                         Start time: 9:01am   End time: 9:22am   Total Mins: 21 minutes  AGREEMENTS SECTION   Overall Goal(s): Smoking Cessation                                                 Agreement/Action Steps:  Alternate times using a lozenge when nicotine craving is intense (e.g., in the morning, after eating, driving, bored). Practice deep breathing when urge to smoke occurs Engage in household chores and other tasks as distractions Be mindful of how many cigarettes smoked and when daily    Progress Notes:  Patient reported that she did not use the lozenges to deter smoking over the past week. Patient stated that she was not able to focus on quitting due to other issues that she faced and stressed triggered her to smoke. Patient shared that her concentration on other things such as pain management and the holidays were challenges that caused her not to pay attention to how many cigarettes she smoked and when.  Patient stated that she tried to smoke less because she goes outside to smoke, and the weather was a deterrent. Patient mentioned that she would grab a cigarette instead of using a lozenge. Patient shared that she did practice deep breathing to get through certain moments without smoking but found that the urge would return. Patient expressed that engaging in physical activities like household chores was not helpful strategy because of the pain she was experiencing.    Indicators of Success and Accountability:  Patient self-reflected on her smoking behavior over the past two weeks and determined that she needs to find solutions to her stressors to be able to put more focus in working towards her goal.   Readiness: Patient is in the action stage of smoking cessation.  Strengths and Supports: Patient is relying on being solution focused.  Challenges and Barriers: Patient is faced with multiple challenges that has triggered stress  which has taken her focus off smoking cessation.    Coaching Outcomes: Patient desires to attempt implementing her action steps over the next two weeks as outlined above to work towards reducing her smoking over time.   Patient did not revise her action steps during this session. Patient is seeking pain management to aid in focusing more on quitting smoking.     Attempted: Fulfilled - patient practiced deep breathing to deter smoking.  Partial - Patient's engagement in household chores/tasks were limited to distract her from smoking.   Not met - Patient did not use the lozenges to deter smoking over the past two weeks. Patient was not mindful of how many cigarettes she smoked and when.

## 2023-03-18 ENCOUNTER — Ambulatory Visit: Payer: BC Managed Care – PPO

## 2023-03-18 ENCOUNTER — Telehealth: Payer: Self-pay

## 2023-03-18 DIAGNOSIS — Z0000 Encounter for general adult medical examination without abnormal findings: Secondary | ICD-10-CM

## 2023-03-18 NOTE — Telephone Encounter (Signed)
Called patient per scheduled health coaching session. Patient is sick and requested to reschedule. Patient has been schedule for her next health coaching appointment on 9/27 at 9:00am. Patient will be called at that time.   Renaee Munda, MS, ERHD, [facility]  Care Guide, Health & Wellness Coach [address] [place] [address] Telephone: [phone] Email: [email or web address]

## 2023-03-21 ENCOUNTER — Ambulatory Visit: Payer: BC Managed Care – PPO | Attending: Cardiology | Admitting: Cardiology

## 2023-03-21 VITALS — BP 110/60 | HR 66 | Ht 62.0 in | Wt 151.0 lb

## 2023-03-21 DIAGNOSIS — G4733 Obstructive sleep apnea (adult) (pediatric): Secondary | ICD-10-CM

## 2023-03-21 NOTE — Patient Instructions (Signed)
Medication Instructions:  Your physician recommends that you continue on your current medications as directed. Please refer to the Current Medication list given to you today.  *If you need a refill on your cardiac medications before your next appointment, please call your pharmacy*   Lab Work: None.   If you have labs (blood work) drawn today and your tests are completely normal, you will receive your results only by: MyChart Message (if you have MyChart) OR A paper copy in the mail If you have any lab test that is abnormal or we need to change your treatment, we will call you to review the results.   Testing/Procedures: None.   Follow-Up:  Your next appointment:   8 week(s)  Provider:   Dr. Armanda Magic, MD

## 2023-03-21 NOTE — Progress Notes (Signed)
Sleep Medicine CONSULT Note    Date:  03/21/2023   ID:  Wilkins, Lisa [birth year], MRN [id number]  PCP:  Lavada Mesi, MD  Cardiologist: Nanetta Batty, MD  Chief Complaint  Patient presents with   New Patient (Initial Visit)    Obstructive sleep apnea    History of Present Illness:  Lisa Wilkins is a 52 y.o. female who is being seen today for the evaluation of obstructive apnea at the request of Nanetta Batty, MD.  This is a 52 year old female with a history of GERD and hyperlipidemia.  When he was seen by Dr. Allyson Sabal in July 2024 she complained of nocturnal snoring and excessive daytime sleepiness.  She apparently had had a sleep study in the past which was abnormal and led to a prescription for CPAP but this was never filled.  His STOP-BANG score was 5.  She underwent home sleep study 10/25/2022 which showed moderate obstructive sleep apnea with an AHI of 21.3/h.  She underwent CPAP titration 12/25/2022 and was titrated to CPAP at 13 cm H2O.  She is now referred for sleep medicine consultation to establish sleep care and treatment of obstructive sleep apnea.  She is doing well with his PAP device and thinks that she has gotten used to it. She started off with a nasal mask but she breathes through her mouth so she was switched to a FFM.  The FFM is leaking because the pressure is so high and this is preventing her to sleep through the night.  She averages about 2-2.5hrs and then wakes up with air rushing and blows her cheeks out and takes her breath away. Since going on PAP she feels rested in the am and has no significant daytime sleepiness.  She denies any significant mouth or nasal dryness or nasal congestion.  She does not think that she snores.    Past Medical History:  Diagnosis Date   Allergy    Arthritis    Rheumatoid    Bronchitis    Generalized headaches    GERD (gastroesophageal reflux disease)    Hyperlipidemia    Thyroid disease    Graves      Past Surgical History:  Procedure Laterality Date   ABDOMINAL HYSTERECTOMY     partial for alleviating arthritis   CHOLECYSTECTOMY     COLONOSCOPY N/A 08/20/2014   Procedure: COLONOSCOPY;  Surgeon: Elnita Maxwell, MD;  Location: [facility] ENDOSCOPY;  Service: Endoscopy;  Laterality: N/A;   KNEE ARTHROSCOPY Left    TONSILLECTOMY     TUBAL LIGATION      Current Medications: Current Meds  Medication Sig   acetaminophen (TYLENOL) 650 MG CR tablet Take 1,300 mg by mouth every 8 (eight) hours as needed for pain.   albuterol (PROVENTIL HFA;VENTOLIN HFA) 108 (90 Base) MCG/ACT inhaler Inhale 1-2 puffs into the lungs every 6 (six) hours as needed for wheezing or shortness of breath.   nicotine (NICODERM CQ - DOSED IN MG/24 HOURS) 21 mg/24hr patch Place 1 patch (21 mg total) onto the skin daily.   nicotine polacrilex (COMMIT) 4 MG lozenge Take 1 lozenge (4 mg total) by mouth as needed for smoking cessation.   sulindac (CLINORIL) 200 MG tablet    SYNTHROID 175 MCG tablet Take 175 mcg by mouth daily.    Allergies:   Elemental sulfur, Remicade [infliximab], and Tramadol   Social History   Socioeconomic History   Marital status: Married    Spouse name: Not on file  Number of children: Not on file   Years of education: Not on file   Highest education level: Not on file  Occupational History   Not on file  Tobacco Use   Smoking status: Heavy Smoker    Current packs/day: 1.00    Average packs/day: 1 pack/day for 20.0 years (20.0 ttl pk-yrs)    Types: Cigarettes   Smokeless tobacco: Never   Tobacco comments:    Patient is engaging in health coaching for smoking cessation.  Vaping Use   Vaping status: Never Used  Substance and Sexual Activity   Alcohol use: No   Drug use: No   Sexual activity: Yes    Birth control/protection: Surgical  Other Topics Concern   Not on file  Social History Narrative   Not on file   Social Drivers of Health   Financial Resource Strain: Not on  file  Food Insecurity: Not on file  Transportation Needs: Not on file  Physical Activity: Not on file  Stress: Not on file (02/06/2023)  Social Connections: Not on file     Family History:  The patient's family history includes Rheum arthritis in her mother; Stroke in her father.   ROS:   Please see the history of present illness.    ROS All other systems reviewed and are negative.      No data to display             PHYSICAL EXAM:   VS:  BP 110/60 (BP Location: Right Arm)   Pulse 66   Ht 5\' 2"  (1.575 m)   Wt 151 lb (68.5 kg)   SpO2 96%   BMI 27.62 kg/m    GEN: Well nourished, well developed, in no acute distress  HEENT: normal  Neck: no JVD, carotid bruits, or masses Cardiac: RRR; no murmurs, rubs, or gallops,no edema.  Intact distal pulses bilaterally.  Respiratory:  clear to auscultation bilaterally, normal work of breathing GI: soft, nontender, nondistended, + BS MS: no deformity or atrophy  Skin: warm and dry, no rash Neuro:  Alert and Oriented x 3, Strength and sensation are intact Psych: euthymic mood, full affect  Wt Readings from Last 3 Encounters:  03/21/23 151 lb (68.5 kg)  02/28/23 155 lb (70.3 kg)  01/21/23 153 lb (69.4 kg)      Studies/Labs Reviewed:   Home sleep study, CPAP titration, PAP compliance download  Recent Labs: 02/28/2023: ALT 9; BUN 14; Creatinine, Ser 0.84; Hemoglobin 12.1; Platelets 256; Potassium 4.2; Sodium 137     ASSESSMENT:    1. OSA (obstructive sleep apnea)      PLAN:  In order of problems listed above:  OSA - The patient has been having problems with the FFM leaking due to the pressure being too high.  Initially used a nasal mask but she was mouth breathing so changed to a FFM but the pressure is too high and is blowing out her cheeks and eyes.   The PAP download performed by his DME was personally reviewed and interpreted by me today and showed an AHI of 1.5 /hr on 13 cm H2O with 73% compliance in using more than  4 hours nightly.  The patient has been using and benefiting from PAP use and will continue to benefit from therapy.  -I will change her to auto CPAP from 4 to 10cm H2O -I will get a download in 4 weeks -encouraged her to be more compliant with her device -followup with me in 8 weeks  Time  Spent: 20 minutes total time of encounter, including 15 minutes spent in face-to-face patient care on the date of this encounter. This time includes coordination of care and counseling regarding above mentioned problem list. Remainder of non-face-to-face time involved reviewing chart documents/testing relevant to the patient encounter and documentation in the medical record. I have independently reviewed documentation from referring provider  Medication Adjustments/Labs and Tests Ordered: Current medicines are reviewed at length with the patient today.  Concerns regarding medicines are outlined above.  Medication changes, Labs and Tests ordered today are listed in the Patient Instructions below.  There are no Patient Instructions on file for this visit.   Signed, Armanda Magic, MD  03/21/2023 10:48 AM    [facility] [address] [place], [place], [place]  [address] Phone: [phone]; Fax: [phone]

## 2023-03-22 ENCOUNTER — Telehealth: Payer: Self-pay | Admitting: *Deleted

## 2023-03-22 DIAGNOSIS — R002 Palpitations: Secondary | ICD-10-CM

## 2023-03-22 DIAGNOSIS — G4733 Obstructive sleep apnea (adult) (pediatric): Secondary | ICD-10-CM

## 2023-03-22 NOTE — Telephone Encounter (Signed)
Order placed to [facility] via community message.

## 2023-03-22 NOTE — Telephone Encounter (Signed)
-----   Message from Armanda Magic sent at 03/21/2023 10:55 AM EST ----- change her to auto CPAP from 4 to 10cm H2O and get a download in 4 weeks

## 2023-03-29 ENCOUNTER — Telehealth: Payer: Self-pay

## 2023-03-29 ENCOUNTER — Ambulatory Visit: Payer: BC Managed Care – PPO

## 2023-03-29 DIAGNOSIS — Z0000 Encounter for general adult medical examination without abnormal findings: Secondary | ICD-10-CM

## 2023-03-29 NOTE — Telephone Encounter (Signed)
Called patient after receiving email that she needed to cancel her appointment due to unexpected caregiving responsibilities. Patient has been rescheduled for 04/09/23 at 9:00am for a telephonic health coaching appointment. Patient will be called at that time.   Renaee Munda, MS, ERHD, [facility]  Care Guide, Health & Wellness Coach [address] [place] [address] Telephone: [phone] Email: [email or web address]

## 2023-04-09 ENCOUNTER — Telehealth: Payer: Self-pay

## 2023-04-09 ENCOUNTER — Ambulatory Visit: Payer: BC Managed Care – PPO

## 2023-04-09 DIAGNOSIS — Z0000 Encounter for general adult medical examination without abnormal findings: Secondary | ICD-10-CM

## 2023-04-09 NOTE — Telephone Encounter (Signed)
 Appointment Outcome:  Completed, Session #:                         Start time: 9:03am   End time: 9:15am   Total Mins: 12 minutes  AGREEMENTS SECTION   Overall Goal(s): Smoking Cessation                                                 Agreement/Action Steps:  Alternate times using a lozenge when nicotine  craving is intense (e.g., in the morning, after eating, driving, bored). Practice deep breathing when urge to smoke occurs Engage in household chores and other tasks as distractions Be mindful of how many cigarettes smoked and when daily    Progress Notes:  Patient expressed that she is still working with her provider to find a way to manage her chronic pain. Patient stated that it is taking up her mental space focusing on the pain and has not been able to put her full effort into quitting. Patient shared that she understands that quitting will help improve her health and reduce inflammation, but finds that smoking continues to be her way of coping with pain. Patient requested that health coaching is postponed for at least a month to give her time to work with her provider before attempting to quit again.   Indicators of Success and Accountability:  Patient has limited her smoking to certain times of the day.  Readiness: Patient is in the contemplation stage of smoking cessation.    Strengths and Supports: Patient is relying on her faith.  Challenges and Barriers: Patient expressed that finding a way to manage her chronic pain is her main focus at this time.      Coaching Outcomes: Patient will continue to work towards reducing her smoking over the next month as much as possible.   Health coaching appointment has been scheduled as requested per patient.      Not attempted: Deferred - Patient has decided to postpone participation in health coaching until she is able to address her challenge with chronic pain.

## 2023-05-15 ENCOUNTER — Telehealth (HOSPITAL_BASED_OUTPATIENT_CLINIC_OR_DEPARTMENT_OTHER): Payer: Self-pay

## 2023-05-15 ENCOUNTER — Ambulatory Visit (HOSPITAL_BASED_OUTPATIENT_CLINIC_OR_DEPARTMENT_OTHER): Payer: BC Managed Care – PPO

## 2023-05-15 DIAGNOSIS — Z0000 Encounter for general adult medical examination without abnormal findings: Secondary | ICD-10-CM

## 2023-05-15 NOTE — Telephone Encounter (Signed)
Called patient per scheduled health coaching appt. Patient did not answer. Left message for patient to return call to discuss continued participation in the health coaching program.   Renaee Munda, MS, ERHD, [facility]  Care Guide, Health & Wellness Coach [address] [place] [address] Telephone: [phone] Email: [name]

## 2023-05-23 ENCOUNTER — Ambulatory Visit: Payer: BC Managed Care – PPO | Admitting: Cardiology

## 2023-05-23 NOTE — Progress Notes (Deleted)
 Sleep Medicine  Note    Date:  05/23/2023   ID:  Exie, Chrismer 04/01/1971, MRN [id number]  PCP:  Lavada Mesi, MD  Cardiologist: Nanetta Batty, MD  No chief complaint on file.   History of Present Illness:  Lisa Wilkins is a 53 y.o. female with a history of GERD and hyperlipidemia.  When he was seen by Dr. Allyson Sabal in July 2024 she complained of nocturnal snoring and excessive daytime sleepiness.  She apparently had had a sleep study in the past which was abnormal and led to a prescription for CPAP but this was never filled.  His STOP-BANG score was 5.  She underwent home sleep study 10/25/2022 which showed moderate obstructive sleep apnea with an AHI of 21.3/h.  She underwent CPAP titration 12/25/2022 and was titrated to CPAP at 13 cm H2O.    She started off with a nasal mask but she breathes through her mouth so she was switched to a FFM.  At last OV she complained of the FFM leaking because the pressure was so high and this was preventing her to sleep through the night.  Her CPAP was changed to auto CPAP from 4 to 10cm H2O   She is doing well with her PAP device and thinks that she has gotten used to it.  She tolerates the mask and feels the pressure is adequate.  Since going on PAP she feels rested in the am and has no significant daytime sleepiness.  She denies any significant mouth or nasal dryness or nasal congestion.  She does not think that he snores.   Past Medical History:  Diagnosis Date   Allergy    Arthritis    Rheumatoid    Bronchitis    Generalized headaches    GERD (gastroesophageal reflux disease)    Hyperlipidemia    Thyroid disease    Graves     Past Surgical History:  Procedure Laterality Date   ABDOMINAL HYSTERECTOMY     partial for alleviating arthritis   CHOLECYSTECTOMY     COLONOSCOPY N/A 08/20/2014   Procedure: COLONOSCOPY;  Surgeon: Elnita Maxwell, MD;  Location: [facility] ENDOSCOPY;  Service: Endoscopy;  Laterality: N/A;    KNEE ARTHROSCOPY Left    TONSILLECTOMY     TUBAL LIGATION      Current Medications: No outpatient medications have been marked as taking for the 05/23/23 encounter (Appointment) with Quintella Reichert, MD.    Allergies:   Elemental sulfur, Remicade [infliximab], and Tramadol   Social History   Socioeconomic History   Marital status: Married    Spouse name: Not on file   Number of children: Not on file   Years of education: Not on file   Highest education level: Not on file  Occupational History   Not on file  Tobacco Use   Smoking status: Heavy Smoker    Current packs/day: 1.00    Average packs/day: 1 pack/day for 20.0 years (20.0 ttl pk-yrs)    Types: Cigarettes   Smokeless tobacco: Never   Tobacco comments:    Patient is engaging in health coaching for smoking cessation.  Vaping Use   Vaping status: Never Used  Substance and Sexual Activity   Alcohol use: No   Drug use: No   Sexual activity: Yes    Birth control/protection: Surgical  Other Topics Concern   Not on file  Social History Narrative   Not on file   Social Drivers of Health   Financial  Resource Strain: Not on file  Food Insecurity: Not on file  Transportation Needs: Not on file  Physical Activity: Not on file  Stress: Not on file (02/06/2023)  Social Connections: Not on file     Family History:  The patient's family history includes Rheum arthritis in her mother; Stroke in her father.   ROS:   Please see the history of present illness.    ROS All other systems reviewed and are negative.      No data to display             PHYSICAL EXAM:   VS:  There were no vitals taken for this visit.   GEN: Well nourished, well developed in no acute distress HEENT: Normal NECK: No JVD; No carotid bruits LYMPHATICS: No lymphadenopathy CARDIAC:RRR, no murmurs, rubs, gallops RESPIRATORY:  Clear to auscultation without rales, wheezing or rhonchi  ABDOMEN: Soft, non-tender,  non-distended MUSCULOSKELETAL:  No edema; No deformity  SKIN: Warm and dry NEUROLOGIC:  Alert and oriented x 3 PSYCHIATRIC:  Normal affect  Wt Readings from Last 3 Encounters:  03/21/23 151 lb (68.5 kg)  02/28/23 155 lb (70.3 kg)  01/21/23 153 lb (69.4 kg)      Studies/Labs Reviewed:   Home sleep study, CPAP titration, PAP compliance download  Recent Labs: 02/28/2023: ALT 9; BUN 14; Creatinine, Ser 0.84; Hemoglobin 12.1; Platelets 256; Potassium 4.2; Sodium 137     ASSESSMENT:    1. OSA (obstructive sleep apnea)       PLAN:  In order of problems listed above:  OSA - The patient is tolerating PAP therapy well without any problems. The PAP download performed by his DME was personally reviewed and interpreted by me today and showed an AHI of ***/hr on *** cm H2O with ***% compliance in using more than 4 hours nightly.  The patient has been using and benefiting from PAP use and will continue to benefit from therapy.   Time Spent: 20 minutes total time of encounter, including 15 minutes spent in face-to-face patient care on the date of this encounter. This time includes coordination of care and counseling regarding above mentioned problem list. Remainder of non-face-to-face time involved reviewing chart documents/testing relevant to the patient encounter and documentation in the medical record. I have independently reviewed documentation from referring provider  Medication Adjustments/Labs and Tests Ordered: Current medicines are reviewed at length with the patient today.  Concerns regarding medicines are outlined above.  Medication changes, Labs and Tests ordered today are listed in the Patient Instructions below.  There are no Patient Instructions on file for this visit.   Signed, Armanda Magic, MD  05/23/2023 9:44 AM    [facility] [address] [place], [place], [place]  [address] Phone: [phone]; Fax: [phone]

## 2023-07-14 NOTE — Progress Notes (Unsigned)
 Sleep Medicine Note    Date:  07/15/2023   ID:  Lisa Wilkins, DOB Jan 29, 1971, MRN [id number]  PCP:  Rey Catholic, MD  Cardiologist: Lauro Portal, MD  Chief Complaint  Patient presents with   Sleep Apnea    History of Present Illness:  Lisa Wilkins is a 53 y.o. female with a history of GERD and hyperlipidemia.  When he was seen by Dr. Katheryne Pane in July 2024 she complained of nocturnal snoring and excessive daytime sleepiness.  She apparently had had a sleep study in the past which was abnormal and led to a prescription for CPAP but this was never filled.  Her STOP-BANG score was 5.  She underwent home sleep study 10/25/2022 which showed moderate obstructive sleep apnea with an AHI of 21.3/h.  She underwent CPAP titration 12/25/2022 and was titrated to CPAP at 13 cm H2O.    At last OV she complained of the pressure being too high and blowing into her eyes.  She was changed to auto CPAP from 4 to 10cm H2O.  She is doing well with her PAP device and thinks that she has gotten used to it.  She tolerates the full face mask and feels the pressure is adequate.  Since going on PAP she feels rested in the am and has no significant daytime sleepiness.  She denies any significant nasal dryness or nasal congestion.  She has problems with mouth dryness.  She has been having problems with gas.   She does not think that he snores.    Past Medical History:  Diagnosis Date   Allergy    Arthritis    Rheumatoid    Bronchitis    Generalized headaches    GERD (gastroesophageal reflux disease)    Hyperlipidemia    Thyroid disease    Graves     Past Surgical History:  Procedure Laterality Date   ABDOMINAL HYSTERECTOMY     partial for alleviating arthritis   CHOLECYSTECTOMY     COLONOSCOPY N/A 08/20/2014   Procedure: COLONOSCOPY;  Surgeon: Luella Sager, MD;  Location: [facility] ENDOSCOPY;  Service: Endoscopy;  Laterality: N/A;   KNEE ARTHROSCOPY Left    TONSILLECTOMY     TUBAL  LIGATION      Current Medications: Current Meds  Medication Sig   acetaminophen (TYLENOL) 650 MG CR tablet Take 1,300 mg by mouth every 8 (eight) hours as needed for pain.   albuterol (PROVENTIL HFA;VENTOLIN HFA) 108 (90 Base) MCG/ACT inhaler Inhale 1-2 puffs into the lungs every 6 (six) hours as needed for wheezing or shortness of breath.   nicotine (NICODERM CQ - DOSED IN MG/24 HOURS) 21 mg/24hr patch Place 1 patch (21 mg total) onto the skin daily.   SYNTHROID 175 MCG tablet Take 175 mcg by mouth daily.    Allergies:   Elemental sulfur, Remicade [infliximab], and Tramadol   Social History   Socioeconomic History   Marital status: Married    Spouse name: Not on file   Number of children: Not on file   Years of education: Not on file   Highest education level: Not on file  Occupational History   Not on file  Tobacco Use   Smoking status: Heavy Smoker    Current packs/day: 1.00    Average packs/day: 1 pack/day for 20.0 years (20.0 ttl pk-yrs)    Types: Cigarettes   Smokeless tobacco: Never   Tobacco comments:    Patient is engaging in health coaching for smoking cessation.  Vaping Use   Vaping status: Never Used  Substance and Sexual Activity   Alcohol use: No   Drug use: No   Sexual activity: Yes    Birth control/protection: Surgical  Other Topics Concern   Not on file  Social History Narrative   Not on file   Social Drivers of Health   Financial Resource Strain: Not on file  Food Insecurity: Not on file  Transportation Needs: Not on file  Physical Activity: Not on file  Stress: Not on file (02/06/2023)  Social Connections: Not on file     Family History:  The patient's family history includes Rheum arthritis in her mother; Stroke in her father.   ROS:   Please see the history of present illness.    ROS All other systems reviewed and are negative.      No data to display             PHYSICAL EXAM:   VS:  BP 120/72   Pulse 80   Ht 5\' 2"  (1.575  m)   Wt 151 lb 12.8 oz (68.9 kg)   SpO2 95%   BMI 27.76 kg/m    \GEN: Well nourished, well developed in no acute distress HEENT: Normal NECK: No JVD; No carotid bruits LYMPHATICS: No lymphadenopathy CARDIAC:RRR, no murmurs, rubs, gallops RESPIRATORY:  Clear to auscultation without rales, wheezing or rhonchi  ABDOMEN: Soft, non-tender, non-distended MUSCULOSKELETAL:  No edema; No deformity  SKIN: Warm and dry NEUROLOGIC:  Alert and oriented x 3 PSYCHIATRIC:  Normal affect    Wt Readings from Last 3 Encounters:  07/15/23 151 lb 12.8 oz (68.9 kg)  03/21/23 151 lb (68.5 kg)  02/28/23 155 lb (70.3 kg)      Studies/Labs Reviewed:   Home sleep study, CPAP titration, PAP compliance download  Recent Labs: 02/28/2023: ALT 9; BUN 14; Creatinine, Ser 0.84; Hemoglobin 12.1; Platelets 256; Potassium 4.2; Sodium 137     ASSESSMENT:    1. OSA (obstructive sleep apnea)       PLAN:  In order of problems listed above:  OSA - The patient is tolerating PAP therapy well without any problems. The PAP download performed by his DME was personally reviewed and interpreted by me today and showed an AHI of 3.3/hr on auto CPAP from 4 to 10 cm H2O with 63% compliance in using more than 4 hours nightly.  The patient has been using and benefiting from PAP use and will continue to benefit from therapy.  -I have encouraged her to be more compliant with her device>>she has had problems sleeping due to menopause sx and RA pain -I will get her a chin strap to try to keep her mouth closed to help with swallowing air  Followup with me in 1 year  Time Spent: 20 minutes total time of encounter, including 15 minutes spent in face-to-face patient care on the date of this encounter. This time includes coordination of care and counseling regarding above mentioned problem list. Remainder of non-face-to-face time involved reviewing chart documents/testing relevant to the patient encounter and documentation in the  medical record. I have independently reviewed documentation from referring provider  Medication Adjustments/Labs and Tests Ordered: Current medicines are reviewed at length with the patient today.  Concerns regarding medicines are outlined above.  Medication changes, Labs and Tests ordered today are listed in the Patient Instructions below.  There are no Patient Instructions on file for this visit.   Signed, Gaylyn Keas, MD  07/15/2023 10:18 AM  [facility] [address] [place], [place], [place]  [address] Phone: [phone]; Fax: [phone]

## 2023-07-15 ENCOUNTER — Encounter: Payer: Self-pay | Admitting: Cardiology

## 2023-07-15 ENCOUNTER — Ambulatory Visit: Payer: BC Managed Care – PPO | Attending: Cardiology | Admitting: Cardiology

## 2023-07-15 VITALS — BP 120/72 | HR 80 | Ht 62.0 in | Wt 151.8 lb

## 2023-07-15 DIAGNOSIS — G4733 Obstructive sleep apnea (adult) (pediatric): Secondary | ICD-10-CM

## 2023-07-15 NOTE — Patient Instructions (Signed)
 Medication Instructions:  Your physician recommends that you continue on your current medications as directed. Please refer to the Current Medication list given to you today.  *If you need a refill on your cardiac medications before your next appointment, please call your pharmacy*  Lab Work: none If you have labs (blood work) drawn today and your tests are completely normal, you will receive your results only by: MyChart Message (if you have MyChart) OR A paper copy in the mail If you have any lab test that is abnormal or we need to change your treatment, we will call you to review the results.  Testing/Procedures: none  Follow-Up: At [facility], you and your health needs are our priority.  As part of our continuing mission to provide you with exceptional heart care, our providers are all part of one team.  This team includes your primary Cardiologist (physician) and Advanced Practice Providers or APPs (Physician Assistants and Nurse Practitioners) who all work together to provide you with the care you need, when you need it.  Your next appointment:   12 month(s)  Provider:   Dr Micael Adas    We recommend signing up for the patient portal called "MyChart".  Sign up information is provided on this After Visit Summary.  MyChart is used to connect with patients for Virtual Visits (Telemedicine).  Patients are able to view lab/test results, encounter notes, upcoming appointments, etc.  Non-urgent messages can be sent to your provider as well.   To learn more about what you can do with MyChart, go to [email or web address].   Other Instructions        1st Floor: - Lobby - Registration  - Pharmacy  - Lab - Cafe  2nd Floor: - PV Lab - Diagnostic Testing (echo, CT, nuclear med)  3rd Floor: - Vacant  4th Floor: - TCTS (cardiothoracic surgery) - AFib Clinic - Structural Heart Clinic - Vascular Surgery  - Vascular Ultrasound  5th Floor: - [facility]  (general and EP) - Clinical Pharmacy for coumadin, hypertension, lipid, weight-loss medications, and med management appointments    Valet parking services will be available as well.

## 2023-07-16 ENCOUNTER — Other Ambulatory Visit: Payer: Self-pay

## 2023-07-16 DIAGNOSIS — Z0000 Encounter for general adult medical examination without abnormal findings: Secondary | ICD-10-CM

## 2023-07-16 DIAGNOSIS — R0789 Other chest pain: Secondary | ICD-10-CM

## 2023-07-16 DIAGNOSIS — E782 Mixed hyperlipidemia: Secondary | ICD-10-CM

## 2023-07-16 DIAGNOSIS — R002 Palpitations: Secondary | ICD-10-CM

## 2023-07-16 DIAGNOSIS — G4733 Obstructive sleep apnea (adult) (pediatric): Secondary | ICD-10-CM

## 2023-07-16 NOTE — Progress Notes (Signed)
 Order for a chin strap sent to [facility] today

## 2023-08-19 ENCOUNTER — Other Ambulatory Visit (INDEPENDENT_AMBULATORY_CARE_PROVIDER_SITE_OTHER)

## 2023-08-19 ENCOUNTER — Encounter: Payer: Self-pay | Admitting: Physician Assistant

## 2023-08-19 ENCOUNTER — Ambulatory Visit (INDEPENDENT_AMBULATORY_CARE_PROVIDER_SITE_OTHER): Admitting: Physician Assistant

## 2023-08-19 VITALS — BP 116/76 | HR 82 | Ht 61.75 in | Wt 152.5 lb

## 2023-08-19 DIAGNOSIS — K5909 Other constipation: Secondary | ICD-10-CM

## 2023-08-19 DIAGNOSIS — R140 Abdominal distension (gaseous): Secondary | ICD-10-CM

## 2023-08-19 DIAGNOSIS — K760 Fatty (change of) liver, not elsewhere classified: Secondary | ICD-10-CM | POA: Diagnosis not present

## 2023-08-19 DIAGNOSIS — K219 Gastro-esophageal reflux disease without esophagitis: Secondary | ICD-10-CM

## 2023-08-19 DIAGNOSIS — R1031 Right lower quadrant pain: Secondary | ICD-10-CM | POA: Diagnosis not present

## 2023-08-19 DIAGNOSIS — R933 Abnormal findings on diagnostic imaging of other parts of digestive tract: Secondary | ICD-10-CM

## 2023-08-19 DIAGNOSIS — K838 Other specified diseases of biliary tract: Secondary | ICD-10-CM

## 2023-08-19 LAB — CBC WITH DIFFERENTIAL/PLATELET
Basophils Absolute: 0.1 10*3/uL (ref 0.0–0.1)
Basophils Relative: 0.9 % (ref 0.0–3.0)
Eosinophils Absolute: 0.3 10*3/uL (ref 0.0–0.7)
Eosinophils Relative: 3.7 % (ref 0.0–5.0)
HCT: 41.6 % (ref 36.0–46.0)
Hemoglobin: 13.8 g/dL (ref 12.0–15.0)
Lymphocytes Relative: 26.4 % (ref 12.0–46.0)
Lymphs Abs: 1.8 10*3/uL (ref 0.7–4.0)
MCHC: 33.2 g/dL (ref 30.0–36.0)
MCV: 84.7 fl (ref 78.0–100.0)
Monocytes Absolute: 0.5 10*3/uL (ref 0.1–1.0)
Monocytes Relative: 7.6 % (ref 3.0–12.0)
Neutro Abs: 4.2 10*3/uL (ref 1.4–7.7)
Neutrophils Relative %: 61.4 % (ref 43.0–77.0)
Platelets: 266 10*3/uL (ref 150.0–400.0)
RBC: 4.92 Mil/uL (ref 3.87–5.11)
RDW: 15.8 % — ABNORMAL HIGH (ref 11.5–15.5)
WBC: 6.9 10*3/uL (ref 4.0–10.5)

## 2023-08-19 LAB — COMPREHENSIVE METABOLIC PANEL WITH GFR
ALT: 6 U/L (ref 0–35)
AST: 12 U/L (ref 0–37)
Albumin: 4.3 g/dL (ref 3.5–5.2)
Alkaline Phosphatase: 151 U/L — ABNORMAL HIGH (ref 39–117)
BUN: 16 mg/dL (ref 6–23)
CO2: 26 meq/L (ref 19–32)
Calcium: 9.6 mg/dL (ref 8.4–10.5)
Chloride: 102 meq/L (ref 96–112)
Creatinine, Ser: 0.63 mg/dL (ref 0.40–1.20)
GFR: 101.86 mL/min (ref >60.00)
Glucose, Bld: 95 mg/dL (ref 70–99)
Potassium: 4.3 meq/L (ref 3.5–5.1)
Sodium: 136 meq/L (ref 135–145)
Total Bilirubin: 0.3 mg/dL (ref 0.2–1.2)
Total Protein: 7.2 g/dL (ref 6.0–8.3)

## 2023-08-19 LAB — HIGH SENSITIVITY CRP: CRP, High Sensitivity: 5.23 mg/L — ABNORMAL HIGH (ref 0.000–5.000)

## 2023-08-19 LAB — SEDIMENTATION RATE: Sed Rate: 33 mm/h — ABNORMAL HIGH (ref 0–30)

## 2023-08-19 MED ORDER — NA SULFATE-K SULFATE-MG SULF 17.5-3.13-1.6 GM/177ML PO SOLN: 1.0000 | Freq: Once | ORAL | 0 refills | Status: AC

## 2023-08-19 NOTE — Patient Instructions (Addendum)
 Your provider has requested that you go to the basement level for lab work before leaving today. Press "B" on the elevator. The lab is located at the first door on the left as you exit the elevator.  You will be contacted by [facility] Scheduling in the next 2 days to arrange a Abdominal US .  The number on your caller ID will be [phone], please answer when they call.  If you have not heard from them in 2 days please call [phone] to schedule.     Reflux Gourmet Rescue  It is an ALGINATE THERAPY which is the only intervention that works to safeguard the esophagus by creating a protective barrier that actually stops reflux from happening. -The general directions for use are as stated on the packaging: Take 1 teaspoon (5 ml), or more as needed or as directed by your physician, after meals and before bed. -These general directions address the most common times for reflux to occur, but our Rescue products may be taken anytime. Some individuals may take our product preemptively, when they know they will suffer from reflux, or as needed - when discomfort arises. (If taken around food, it should be consumed last.) -You do not have to take 1 teaspoon (5 ml) of the product. While one teaspoon (5ml) may be the perfect average amount to relieve reflux suffering in some, others may require more or less. You may adjust the amount of Mint Chocolate Rescue and Vanilla Caramel Rescue to the lowest amount necessary to meet your individual needs to improve your quality of life. -You may dilute the product if it is too viscous for you to consume. Keep in mind, however, that the thickness of the product was formulated to provide optimal coating and protection of your throat and esophagus. Though diluting the product is possible, it may reduce the protective function and/or length of action. -This can be used in conjunction with reflux medications and lifestyle changes.  100% ALL-NATURAL  Paraben FREE,  glycerin FREE, & potassium FREE  Made entirely from all-natural ingredients considered safe for children and during pregnancy  No known side effects  All-natural flavor Gluten FREE  Allergen FREE  Vegan  Can find more information here: NameSeizer.co.nz    Toileting tips to help with your constipation - Drink at least 64-80 ounces of water/liquid per day. - Establish a time to try to move your bowels every day.  For many people, this is after a cup of coffee or after a meal such as breakfast. - Sit all of the way back on the toilet keeping your back fairly straight and while sitting up, try to rest the tops of your forearms on your upper thighs.   - Raising your feet with a step stool/squatty potty can be helpful to improve the angle that allows your stool to pass through the rectum. - Relax the rectum feeling it bulge toward the toilet water.  If you feel your rectum raising toward your body, you are contracting rather than relaxing. - Breathe in and slowly exhale. "Belly breath" by expanding your belly towards your belly button. Keep belly expanded as you gently direct pressure down and back to the anus.  A low pitched GRRR sound can assist with increasing intra-abdominal pressure.  (Can also trying to blow on a pinwheel and make it move, this helps with the same belly breathing) - Repeat 3-4 times. If unsuccessful, contract the pelvic floor to restore normal tone and get off the toilet.  Avoid  excessive straining. - To reduce excessive wiping by teaching your anus to normally contract, place hands on outer aspect of knees and resist knee movement outward.  Hold 5-10 second then place hands just inside of knees and resist inward movement of knees.  Hold 5 seconds.  Repeat a few times each way.  Go to the ER if unable to pass gas, severe AB pain, unable to hold down food, any shortness of breath of chest pain. Due to recent changes in healthcare laws, you  may see the results of your imaging and laboratory studies on MyChart before your provider has had a chance to review them.  We understand that in some cases there may be results that are confusing or concerning to you. Not all laboratory results come back in the same time frame and the provider may be waiting for multiple results in order to interpret others.  Please give us  48 hours in order for your provider to thoroughly review all the results before contacting the office for clarification of your results.

## 2023-08-19 NOTE — Progress Notes (Addendum)
 08/19/2023 Lisa Wilkins [id number] 10-20-1970  Referring provider: Rey Catholic, MD Primary GI doctor: Dr. Yvone Herd  ASSESSMENT AND PLAN:  Hepatomegaly, status post cholecystectomy, minor biliary dilation seen on CT 02/28/2023 with alk phos 152 normal AST ALT and total bilirubin. 02/28/2023 CT and pelvis with contrast subtle stranding along ascending colon low-grade proximal colitis retroperitoneal periaortic region low-grade retroperitoneal fibrosis mild intrahepatic biliary dilation with common bile duct 0.8 cm mild biliary dilation may be simple physiological response cholecystectomy She has had recurrent RLQ pain several times a month, has nausea with it, bloating, has constipation/decreased flatulance,  She states chills are normal due to RA with flares.  Some concern for IBD with history of RA and features on CT scan - Schedule endoscopy and colonoscopy to evaluate for ulcers and confirm diagnosis at [facility], Risk of bowel prep, conscious sedation, and EGD and colonoscopy were discussed.  Risks include but are not limited to dehydration, pain, bleeding, cardiopulmonary process, bowel perforation, or other possible adverse outcomes..  Treatment plan was discussed with patient, and agreed upon. - Order labs to assess inflammatory status.  Chronic constipation 08/20/2014 colonoscopy internal hemorrhoids recall 07/2024 KUB 2018 unremarkable stool burden KUB 2021 x-ray mild to moderate stool burden possible hepatomegaly Long-standing constipation with dietary modifications in place. Uses squatty potty and kiwis daily with some improvement. - Continue dietary modifications and use of squatty potty. - Encourage daily consumption of kiwis. - Order labs to evaluate constipation including TSH, has been doing gluten free diet, will not order TTG/IGA.  Gastroesophageal reflux disease (GERD) in setting of chronic NSAID Status post cholecystectomy 05/18/2008 EGD showed gastritis 03/2017  UGI series with SBFT small amount of GERD without stricture or esophagitis 2021 negative celiac negative H. pylori breath test Chronic heartburn managed with probiotics. Discussed alginate therapy as a non-medication option due to anxiety about medications. - Provide information, EGD scheduled, discussed risk with the patient and she agrees to proceed  Rheumatoid arthritis Chronic joint pain managed with sulindac. Discontinued biologics due to COVID-19 concerns. Joint pain not severe. - Continue sulindac. - Consider re-evaluation of biologic therapy if symptoms worsen.  Anxiety disorder Anxiety related to medication use, prefers monitoring when starting new medications. - Consider referral to mental health services. - Discuss strategies for managing medication-related anxiety.      I have reviewed the clinic note as outlined by Santina Cull, PA and agree with the assessment, plan and medical decision making.  Ms. Stalzer presents to the office today for evaluation of right lower quadrant abdominal pain, chronic constipation and heartburn.  CT scan showed subtle stranding along the ascending colon raising possibility of colitis.  She has a history of RA.  Reasonable to proceed with colonoscopy to exclude IBD.  GERD is currently being managed with liquid probiotic and Tums.  Has a prior history of gastritis.  In the setting of abdominal pain and possible IBD it is reasonable to pursue upper endoscopy at the same time as colonoscopy.  Eugenia Hess, MD   Patient Care Team: Rey Catholic, MD as PCP - General (Family Medicine) Avanell Leigh, MD as Consulting Physician (Cardiology) Hamilton Levin (Inactive) (Cardiology)  HISTORY OF PRESENT ILLNESS: 53 y.o. female with a past medical history listed below presents as a new patient for evaluation of abdominal pain.   Previously seen by [facility] Dr. Corky Diener  Discussed the use of AI scribe software for clinical note transcription with  the patient, who gave verbal consent to proceed.  History  of Present Illness   Lisa Wilkins "Lisa Wilkins" is a 53 year old female who presents with abdominal pain.  She experiences abdominal pain located in the right lower quadrant, recurring several times a month. The pain is severe, sometimes lasting all day, and is accompanied by bloating and discomfort in the right upper quadrant. It can be alleviated with arthritis strength Tylenol and a heating pad. Episodes are unpredictable, sometimes waking her from sleep, and are not clearly linked to dietary triggers.  Her gastrointestinal history includes a colonoscopy in 2016 showing hemorrhoids, an x-ray in 2018 indicating mild to moderate stool burden, and a CT scan in November showing possible colitis and bile duct enlargement. An endoscopy in 2010 revealed inflammation, and an upper GI series in 2018 showed reflux. A breath test for celiac disease in 2021 was negative. She has a history of gallbladder removal.  She experiences chronic constipation and has to strain for bowel movements, including diarrhea. There is a sensation of something obstructing her bowel movements, which she can sometimes alleviate by applying pressure externally. She has tried pelvic floor physical therapy without success.  Her past medical history includes rheumatoid arthritis, for which she takes Sulindac. She experiences joint pain and low-grade fevers, and chills during rheumatoid arthritis flares. She discontinued biologics in 2020 due to COVID-19 concerns and her husband's occupation as a Naval architect.  She reports chronic heartburn and has previously been on prescription medications, which she discontinued. She currently uses a liquid probiotic and Tums for symptom management. She has a history of smoking and occasional alcohol use, and she follows a diet with no gluten and limited sugar. No black or bloody stools but reports yellow stools. She experiences severe bloating  and nausea during pain episodes.        She  reports that she has been smoking cigarettes. She has a 20 pack-year smoking history. She has never used smokeless tobacco. She reports that she does not drink alcohol and does not use drugs.  RELEVANT GI HISTORY, IMAGING AND LABS: Results   RADIOLOGY Abdominal X-ray: Mild to moderate stool burden (2021) Upper GI series: Reflux (2018) Abdominal CT: Possible colitis, bile duct enlargement (02/2023)  DIAGNOSTIC Colonoscopy: Hemorrhoids (08/20/2014) Endoscopy: Inflammation (2010) Breath test for celiac: Negative (2021)      CBC    Component Value Date/Time   WBC 6.9 02/28/2023 1841   RBC 4.24 02/28/2023 1841   HGB 12.1 02/28/2023 1841   HCT 36.8 02/28/2023 1841   PLT 256 02/28/2023 1841   MCV 86.8 02/28/2023 1841   MCH 28.5 02/28/2023 1841   MCHC 32.9 02/28/2023 1841   RDW 15.8 (H) 02/28/2023 1841   LYMPHSABS 1.9 02/28/2023 1841   MONOABS 0.5 02/28/2023 1841   EOSABS 0.3 02/28/2023 1841   BASOSABS 0.1 02/28/2023 1841   Recent Labs    02/28/23 1841  HGB 12.1    CMP     Component Value Date/Time   NA 137 02/28/2023 1841   K 4.2 02/28/2023 1841   CL 105 02/28/2023 1841   CO2 23 02/28/2023 1841   GLUCOSE 95 02/28/2023 1841   BUN 14 02/28/2023 1841   CREATININE 0.84 02/28/2023 1841   CALCIUM 9.6 02/28/2023 1841   PROT 7.1 02/28/2023 1841   ALBUMIN 3.8 02/28/2023 1841   AST 17 02/28/2023 1841   ALT 9 02/28/2023 1841   ALKPHOS 152 (H) 02/28/2023 1841   BILITOT 0.3 02/28/2023 1841   GFRNONAA >60 02/28/2023 1841      Latest  Ref Rng & Units 02/28/2023    6:41 PM  Hepatic Function  Total Protein 6.5 - 8.1 g/dL 7.1   Albumin 3.5 - 5.0 g/dL 3.8   AST 15 - 41 U/L 17   ALT 0 - 44 U/L 9   Alk Phosphatase 38 - 126 U/L 152   Total Bilirubin <1.2 mg/dL 0.3       Current Medications:   Current Outpatient Medications (Endocrine & Metabolic):    SYNTHROID 175 MCG tablet, Take 175 mcg by mouth daily.   Current  Outpatient Medications (Respiratory):    albuterol  (PROVENTIL  HFA;VENTOLIN  HFA) 108 (90 Base) MCG/ACT inhaler, Inhale 1-2 puffs into the lungs every 6 (six) hours as needed for wheezing or shortness of breath.  Current Outpatient Medications (Analgesics):    acetaminophen (TYLENOL) 650 MG CR tablet, Take 1,300 mg by mouth every 8 (eight) hours as needed for pain.   sulindac (CLINORIL) 200 MG tablet,    Current Outpatient Medications (Other):    Na Sulfate-K Sulfate-Mg Sulfate concentrate (SUPREP) 17.5-3.13-1.6 GM/177ML SOLN, Take 1 kit (354 mLs total) by mouth once for 1 dose.  Medical History:  Past Medical History:  Diagnosis Date   Allergy    Anxiety    Arthritis    Rheumatoid    Bronchitis    Generalized headaches    GERD (gastroesophageal reflux disease)    Hashimoto's thyroiditis    Hyperlipidemia    Sleep apnea with use of continuous positive airway pressure (CPAP)    Thyroid  disease    Graves    Allergies:  Allergies  Allergen Reactions   Elemental Sulfur Nausea Only   Remicade [Infliximab] Hives   Tramadol Anxiety     Surgical History:  She  has a past surgical history that includes Cholecystectomy; Tonsillectomy; Knee arthroscopy (Left); Colonoscopy (N/A, 08/20/2014); Abdominal hysterectomy; Tubal ligation; and Rotator cuff repair (Left). Family History:  Her family history includes COPD in her mother; Diabetes in her paternal grandfather; Heart disease in her maternal grandmother; Hypertension in her mother; Ovarian cancer in her sister; Rheum arthritis in her mother and paternal grandmother; Stroke in her father; Thyroid  disease in her father, mother, and sister.  REVIEW OF SYSTEMS  : All other systems reviewed and negative except where noted in the History of Present Illness.  PHYSICAL EXAM: BP 116/76 (BP Location: Left Arm, Patient Position: Sitting, Cuff Size: Normal)   Pulse 82   Ht 5' 1.75" (1.568 m) Comment: height measured without shoes  Wt 152 lb 8 oz  (69.2 kg)   BMI 28.12 kg/m  Physical Exam   GENERAL APPEARANCE: Well nourished, in no apparent distress. HEENT: No cervical lymphadenopathy, unremarkable thyroid , sclerae anicteric, conjunctiva pink. RESPIRATORY: Respiratory effort normal, breath sounds equal bilaterally without rales, rhonchi, or wheezing. CARDIO: Regular rate and rhythm with no murmurs, rubs, or gallops, peripheral pulses intact. ABDOMEN: Soft, non-distended, active bowel sounds in all four quadrants, no tenderness to palpation, no rebound, no mass appreciated, abdomen normal. RECTAL: Declines. MUSCULOSKELETAL: Full range of motion, normal gait, without edema. SKIN: Dry, intact without rashes or lesions. No jaundice. NEURO: Alert, oriented, no focal deficits. PSYCH: Cooperative, normal mood and affect.      Edmonia Gottron, PA-C 5:54 PM

## 2023-08-20 LAB — TSH: TSH: 9.23 u[IU]/mL — ABNORMAL HIGH (ref 0.35–5.50)

## 2023-08-21 ENCOUNTER — Ambulatory Visit: Payer: Self-pay | Admitting: Physician Assistant

## 2023-08-29 ENCOUNTER — Ambulatory Visit (HOSPITAL_COMMUNITY)
Admission: RE | Admit: 2023-08-29 | Discharge: 2023-08-29 | Disposition: A | Discharge status: Home / Self Care | Source: Ambulatory Visit | Attending: Physician Assistant | Admitting: Physician Assistant

## 2023-08-29 DIAGNOSIS — K838 Other specified diseases of biliary tract: Secondary | ICD-10-CM | POA: Diagnosis present

## 2023-09-06 ENCOUNTER — Ambulatory Visit: Payer: Self-pay | Admitting: *Deleted

## 2023-09-29 NOTE — Progress Notes (Unsigned)
 [facility] History and Physical   Primary Care Physician:  Hughie Sharper, MD   Reason for Procedure:  GERD, heartburn, history of gastritis, RLQ abdominal pain, abnormal CT with possible colitis, diarrhea and constipation  Plan:    Upper endoscopy and colonoscopy     HPI: Lisa Wilkins is a 53 y.o. female undergoing upper endoscopy and colonoscopy for investigation of GERD, heartburn, history of gastritis, RLQ abdominal pain, diarrhea and constipation.  Heartburn was previously managed with prescription medications which she discontinued.  At the time of her evaluation had been using a liquid probiotic and Tums.  Option of alginate therapy was reviewed with patient at the time of her clinic visit.  She has been endorsing right lower quadrant abdominal pain.  CT scan of the abdomen and pelvis in fall 2024 showed subtle stranding along the ascending colon raising possibility of colitis.  In the setting of RA this raises the specter of possible IBD.  She has had alternating constipation and diarrhea.  Last colonoscopy performed in 2016 was normal.  No documented family history of colorectal cancer or polyps.   Past Medical History:  Diagnosis Date   Allergy    Anxiety    Arthritis    Rheumatoid    Bronchitis    Generalized headaches    GERD (gastroesophageal reflux disease)    Hashimoto's thyroiditis    Hyperlipidemia    Sleep apnea with use of continuous positive airway pressure (CPAP)    Thyroid  disease    Graves     Past Surgical History:  Procedure Laterality Date   ABDOMINAL HYSTERECTOMY     partial for alleviating arthritis   CHOLECYSTECTOMY     COLONOSCOPY N/A 08/20/2014   Procedure: COLONOSCOPY;  Surgeon: [name], MD;  Location: [facility] ENDOSCOPY;  Service: Endoscopy;  Laterality: N/A;   KNEE ARTHROSCOPY Left    x 2   ROTATOR CUFF REPAIR Left    TONSILLECTOMY     TUBAL LIGATION      Prior to Admission medications   Medication Sig  Start Date End Date Taking? Authorizing Provider  acetaminophen (TYLENOL) 650 MG CR tablet Take 1,300 mg by mouth every 8 (eight) hours as needed for pain.    [provider]  albuterol  (PROVENTIL  HFA;VENTOLIN  HFA) 108 (90 Base) MCG/ACT inhaler Inhale 1-2 puffs into the lungs every 6 (six) hours as needed for wheezing or shortness of breath. 07/09/16   Servando Hire, MD  sulindac (CLINORIL) 200 MG tablet  01/09/18   [provider]  SYNTHROID 175 MCG tablet Take 175 mcg by mouth daily. 02/18/23   [provider]    Current Outpatient Medications  Medication Sig Dispense Refill   acetaminophen (TYLENOL) 650 MG CR tablet Take 1,300 mg by mouth every 8 (eight) hours as needed for pain.     albuterol  (PROVENTIL  HFA;VENTOLIN  HFA) 108 (90 Base) MCG/ACT inhaler Inhale 1-2 puffs into the lungs every 6 (six) hours as needed for wheezing or shortness of breath. 1 Inhaler 0   sulindac (CLINORIL) 200 MG tablet   2   SYNTHROID 175 MCG tablet Take 175 mcg by mouth daily.     No current facility-administered medications for this visit.    Allergies as of 09/30/2023 - Review Complete 08/19/2023  Allergen Reaction Noted   Elemental sulfur Nausea Only 08/11/2014   Remicade [infliximab] Hives 08/11/2014   Tramadol Anxiety 12/14/2020    Family History  Problem Relation Age of Onset   Rheum arthritis Mother  psoratic   Hypertension Mother    COPD Mother    Thyroid  disease Mother    Stroke Father    Thyroid  disease Father    Ovarian cancer Sister    Thyroid  disease Sister    Heart disease Maternal Grandmother    Rheum arthritis Paternal Grandmother    Diabetes Paternal Grandfather    Breast cancer Neg Hx     Social History   Socioeconomic History   Marital status: Married    Spouse name: Not on file   Number of children: 3   Years of education: Not on file   Highest education level: Not on file  Occupational History   Occupation: disabled  Tobacco Use    Smoking status: Heavy Smoker    Current packs/day: 1.00    Average packs/day: 1 pack/day for 20.0 years (20.0 ttl pk-yrs)    Types: Cigarettes   Smokeless tobacco: Never   Tobacco comments:    Patient is engaging in health coaching for smoking cessation.  Vaping Use   Vaping status: Never Used  Substance and Sexual Activity   Alcohol use: No   Drug use: No   Sexual activity: Yes    Birth control/protection: Surgical  Other Topics Concern   Not on file  Social History Narrative   Not on file   Social Drivers of Health   Financial Resource Strain: Not on file  Food Insecurity: Not on file  Transportation Needs: Not on file  Physical Activity: Not on file  Stress: Not on file (02/06/2023)  Social Connections: Not on file  Intimate Partner Violence: Low Risk  (07/17/2019)   Received from [facility]   Intimate Partner Violence    Insults You: Not on file    Threatens You: Not on file    Screams at You: Not on file    Physically Hurt: Not on file    Intimate Partner Violence Score: Not on file    Review of Systems:  All other review of systems negative except as mentioned in the HPI.  Physical Exam: Vital signs There were no vitals taken for this visit.  General:   Alert,  Well-developed, well-nourished, pleasant and cooperative in NAD Airway:  Mallampati  Lungs:  Clear throughout to auscultation.   Heart:  Regular rate and rhythm; no murmurs, clicks, rubs,  or gallops. Abdomen:  Soft, nontender and nondistended. Normal bowel sounds.   Neuro/Psych:  Normal mood and affect. A and O x 3  Inocente Hausen, MD [facility]

## 2023-09-30 ENCOUNTER — Encounter: Payer: Self-pay | Admitting: Pediatrics

## 2023-09-30 ENCOUNTER — Ambulatory Visit (AMBULATORY_SURGERY_CENTER): Admitting: Pediatrics

## 2023-09-30 VITALS — BP 102/61 | HR 75 | Temp 97.2°F | Resp 12 | Ht 61.75 in | Wt 152.0 lb

## 2023-09-30 DIAGNOSIS — K635 Polyp of colon: Secondary | ICD-10-CM | POA: Diagnosis not present

## 2023-09-30 DIAGNOSIS — Z8719 Personal history of other diseases of the digestive system: Secondary | ICD-10-CM

## 2023-09-30 DIAGNOSIS — K449 Diaphragmatic hernia without obstruction or gangrene: Secondary | ICD-10-CM

## 2023-09-30 DIAGNOSIS — D125 Benign neoplasm of sigmoid colon: Secondary | ICD-10-CM

## 2023-09-30 DIAGNOSIS — K2950 Unspecified chronic gastritis without bleeding: Secondary | ICD-10-CM

## 2023-09-30 DIAGNOSIS — K5730 Diverticulosis of large intestine without perforation or abscess without bleeding: Secondary | ICD-10-CM | POA: Diagnosis not present

## 2023-09-30 DIAGNOSIS — K648 Other hemorrhoids: Secondary | ICD-10-CM | POA: Diagnosis present

## 2023-09-30 DIAGNOSIS — R1031 Right lower quadrant pain: Secondary | ICD-10-CM

## 2023-09-30 DIAGNOSIS — R197 Diarrhea, unspecified: Secondary | ICD-10-CM

## 2023-09-30 DIAGNOSIS — K219 Gastro-esophageal reflux disease without esophagitis: Secondary | ICD-10-CM

## 2023-09-30 DIAGNOSIS — R933 Abnormal findings on diagnostic imaging of other parts of digestive tract: Secondary | ICD-10-CM

## 2023-09-30 MED ORDER — SODIUM CHLORIDE 0.9 % IV SOLN: 500.0000 mL | Freq: Once | INTRAVENOUS | Status: DC

## 2023-09-30 NOTE — Op Note (Signed)
 [facility] Patient Name: Lisa Wilkins Procedure Date: 09/30/2023 2:14 PM MRN: [id number] Endoscopist: Inocente Hausen , MD, [id number] Age: 53 Referring MD:  Date of Birth: 14-Feb-1971 Gender: Female Account #: [id number] Procedure:                Upper GI endoscopy Indications:              Abdominal pain in the right lower quadrant,                            Heartburn, Follow-up of gastro-esophageal reflux                            diseas, History of gastritis, Constipation and                            diarrhea Medicines:                Monitored Anesthesia Care Procedure:                Pre-Anesthesia Assessment:                           - Prior to the procedure, a History and Physical                            was performed, and patient medications and                            allergies were reviewed. The patient's tolerance of                            previous anesthesia was also reviewed. The risks                            and benefits of the procedure and the sedation                            options and risks were discussed with the patient.                            All questions were answered, and informed consent                            was obtained. Prior Anticoagulants: The patient has                            taken no anticoagulant or antiplatelet agents. ASA                            Grade Assessment: II - A patient with mild systemic                            disease. After reviewing the risks and benefits,  the patient was deemed in satisfactory condition to                            undergo the procedure.                           After obtaining informed consent, the endoscope was                            passed under direct vision. Throughout the                            procedure, the patient's blood pressure, pulse, and                            oxygen saturations were monitored continuously. The                             GIF [id number] was introduced through the                            mouth, and advanced to the second part of duodenum.                            The upper GI endoscopy was accomplished without                            difficulty. The patient tolerated the procedure                            well. Scope In: Scope Out: Findings:                 The examined esophagus was normal.                           The gastric body, gastric antrum, cardia (on                            retroflexion) and gastric fundus (on retroflexion)                            were normal. Biopsies were taken with a cold                            forceps for Helicobacter pylori testing.                           A small hiatal hernia was present.                           The duodenal bulb and second portion of the                            duodenum were normal. Biopsies for histology were  taken with a cold forceps for evaluation of celiac                            disease. Complications:            No immediate complications. Estimated blood loss:                            Minimal. Estimated Blood Loss:     Estimated blood loss was minimal. Impression:               - Normal esophagus.                           - Normal gastric body, antrum, cardia and gastric                            fundus. Biopsied.                           - Small hiatal hernia.                           - Normal duodenal bulb and second portion of the                            duodenum. Biopsied. Recommendation:           - Await pathology results.                           - Perform a colonoscopy today.                           - The findings and recommendations were discussed                            with the patient's family. Inocente Hausen, MD 09/30/2023 2:54:58 PM This report has been signed electronically.

## 2023-09-30 NOTE — Op Note (Signed)
 [facility] Patient Name: Lisa Wilkins Procedure Date: 09/30/2023 2:10 PM MRN: [id number] Endoscopist: Inocente Hausen , MD, [id number] Age: 53 Referring MD:  Date of Birth: 1970-11-05 Gender: Female Account #: [id number] Procedure:                Colonoscopy Indications:              Abdominal pain in the right lower quadrant,                            Abnormal CT of the GI tract, Constipation,                            Diarrhea, Fecal soiling Medicines:                Monitored Anesthesia Care Procedure:                Pre-Anesthesia Assessment:                           - Prior to the procedure, a History and Physical                            was performed, and patient medications and                            allergies were reviewed. The patient's tolerance of                            previous anesthesia was also reviewed. The risks                            and benefits of the procedure and the sedation                            options and risks were discussed with the patient.                            All questions were answered, and informed consent                            was obtained. Prior Anticoagulants: The patient has                            taken no anticoagulant or antiplatelet agents. ASA                            Grade Assessment: II - A patient with mild systemic                            disease. After reviewing the risks and benefits,                            the patient was deemed in satisfactory condition to  undergo the procedure.                           After obtaining informed consent, the colonoscope                            was passed under direct vision. Throughout the                            procedure, the patient's blood pressure, pulse, and                            oxygen saturations were monitored continuously. The                            Olympus Scope [id number] was introduced  through the                            anus and advanced to the terminal ileum. The                            colonoscopy was performed without difficulty. The                            patient tolerated the procedure well. The quality                            of the bowel preparation was adequate. The terminal                            ileum, ileocecal valve, appendiceal orifice, and                            rectum were photographed. Scope In: 2:33:39 PM Scope Out: 2:52:02 PM Scope Withdrawal Time: 0 hours 14 minutes 31 seconds  Total Procedure Duration: 0 hours 18 minutes 23 seconds  Findings:                 The perianal and digital rectal examinations were                            normal. Pertinent negatives include normal                            sphincter tone and no palpable rectal lesions.                           A few small-mouthed diverticula were found in the                            sigmoid colon.                           A 5 mm polyp was found in the sigmoid colon. The  polyp was sessile. The polyp was removed with a                            cold biopsy forceps. Resection and retrieval were                            complete.                           Normal mucosa was found in the entire colon.                            Biopsies for histology were taken with a cold                            forceps for evaluation of microscopic colitis.                           The terminal ileum appeared normal.                           Internal hemorrhoids were found during retroflexion. Complications:            No immediate complications. Estimated blood loss:                            Minimal. Estimated Blood Loss:     Estimated blood loss was minimal. Impression:               - Diverticulosis in the sigmoid colon.                           - One 5 mm polyp in the sigmoid colon, removed with                            a cold biopsy  forceps. Resected and retrieved.                           - Normal mucosa in the entire examined colon.                            Biopsied.                           - The examined portion of the ileum was normal.                           - Internal hemorrhoids. Recommendation:           - Discharge patient to home (ambulatory).                           - Await pathology results.                           - Repeat colonoscopy for surveillance based on  pathology results.                           - The findings and recommendations were discussed                            with the patient's family.                           - Return to GI clinic in 2 months.                           - Patient has a contact number available for                            emergencies. The signs and symptoms of potential                            delayed complications were discussed with the                            patient. Return to normal activities tomorrow.                            Written discharge instructions were provided to the                            patient. Inocente Hausen, MD 09/30/2023 3:01:42 PM This report has been signed electronically.

## 2023-09-30 NOTE — Progress Notes (Signed)
 Called to room to assist during endoscopic procedure.  Patient ID and intended procedure confirmed with present staff. Received instructions for my participation in the procedure from the performing physician.

## 2023-09-30 NOTE — Patient Instructions (Addendum)
 Educational handout provided to patient related to Hemorrhoids, Polyps, Diverticulosis & Hiatal hernia  Resume previous diet  Continue present medications  Awaiting pathology results  YOU HAD AN ENDOSCOPIC PROCEDURE TODAY AT THE [facility]:   Refer to the procedure report that was given to you for any specific questions about what was found during the examination.  If the procedure report does not answer your questions, please call your gastroenterologist to clarify.  If you requested that your care partner not be given the details of your procedure findings, then the procedure report has been included in a sealed envelope for you to review at your convenience later.  YOU SHOULD EXPECT: Some feelings of bloating in the abdomen. Passage of more gas than usual.  Walking can help get rid of the air that was put into your GI tract during the procedure and reduce the bloating. If you had a lower endoscopy (such as a colonoscopy or flexible sigmoidoscopy) you may notice spotting of blood in your stool or on the toilet paper. If you underwent a bowel prep for your procedure, you may not have a normal bowel movement for a few days.  Please Note:  You might notice some irritation and congestion in your nose or some drainage.  This is from the oxygen used during your procedure.  There is no need for concern and it should clear up in a day or so.  SYMPTOMS TO REPORT IMMEDIATELY:  Following lower endoscopy (colonoscopy or flexible sigmoidoscopy):  Excessive amounts of blood in the stool  Significant tenderness or worsening of abdominal pains  Swelling of the abdomen that is new, acute  Fever of 100F or higher  Following upper endoscopy (EGD)  Vomiting of blood or coffee ground material  New chest pain or pain under the shoulder blades  Painful or persistently difficult swallowing  New shortness of breath  Fever of 100F or higher  Black, tarry-looking stools  For urgent or emergent  issues, a gastroenterologist can be reached at any hour by calling [phone]. Do not use MyChart messaging for urgent concerns.    DIET:  We do recommend a small meal at first, but then you may proceed to your regular diet.  Drink plenty of fluids but you should avoid alcoholic beverages for 24 hours.  ACTIVITY:  You should plan to take it easy for the rest of today and you should NOT DRIVE or use heavy machinery until tomorrow (because of the sedation medicines used during the test).    FOLLOW UP: Our staff will call the number listed on your records the next business day following your procedure.  We will call around 7:15- 8:00 am to check on you and address any questions or concerns that you may have regarding the information given to you following your procedure. If we do not reach you, we will leave a message.     If any biopsies were taken you will be contacted by phone or by letter within the next 1-3 weeks.  Please call us  at [phone] if you have not heard about the biopsies in 3 weeks.    SIGNATURES/CONFIDENTIALITY: You and/or your care partner have signed paperwork which will be entered into your electronic medical record.  These signatures attest to the fact that that the information above on your After Visit Summary has been reviewed and is understood.  Full responsibility of the confidentiality of this discharge information lies with you and/or your care-partner.

## 2023-09-30 NOTE — Progress Notes (Signed)
 Sedate, gd SR, tolerated procedure well, VSS, report to RN

## 2023-10-01 ENCOUNTER — Telehealth: Payer: Self-pay | Admitting: Lactation Services

## 2023-10-01 NOTE — Telephone Encounter (Signed)
  Follow up Call-     09/30/2023   12:50 PM  Call back number  Post procedure Call Back phone  # [phone]  Permission to leave phone message Yes     Patient questions:  Do you have a fever, pain , or abdominal swelling? No. Pain Score  0 *  Have you tolerated food without any problems? Yes.    Have you been able to return to your normal activities? Yes.    Do you have any questions about your discharge instructions: Diet   No. Medications  No. Follow up visit  No.  Do you have questions or concerns about your Care? No.  Actions: * If pain score is 4 or above: No action needed, pain <4.

## 2023-10-03 LAB — SURGICAL PATHOLOGY

## 2023-10-05 ENCOUNTER — Ambulatory Visit: Payer: Self-pay | Admitting: Pediatrics

## 2023-12-09 NOTE — Progress Notes (Unsigned)
 [name]   Referring Provider Hughie Sharper, MD [address] [name],  [place] [address]  Primary Care Provider [place], Sharper, MD  Patient Profile: Lisa Wilkins is a 53 y.o. female who returns to the [name] for follow-up of the problem(s) noted below.  Problem List: GERD Hiatal hernia Chronic constipation Left-sided colonic diverticulosis Gas Increased liver echogenicity-possible fatty liver Status postcholecystectomy with minor biliary ductal dilation on CT 02/28/2023 Mildly elevated alkaline phosphatase -other LFTs normal  History of Present Illness   Ms. Lisa Wilkins was last seen in the GI office 08/19/23 by Alan Coombs, PA   Current GI Meds  Reflux Gourmet as needed  Interval History   Discussed the use of AI scribe software for clinical note transcription with the patient, who gave verbal consent to proceed.  History of Present Illness Lisa Wilkins is a 53 year old female with a past medical history noteworthy for migraines, OSA, Graves' disease, rheumatoid arthritis, HLD who returns to the gastroenterology office for follow-up.  GERD - GERD symptoms are well-controlled as long as she follows a strict AIP diet - Avoids dairy and gluten - Since last visit trial of Reflux Gourmet which she states helps tremendously - Utilizes reflux Gourmet once every few days - No report of dysphagia, odynophagia, nausea or vomiting - EGD 09/2023 was endoscopically normal and did show a hiatal hernia  Altered bowel habits, constipation, gas - Intermittent constipation managed with a liquid probiotic and dietary modifications - Diet is low in sugar, dairy, and gluten;  - Typically has one to two bowel movements per day with occasional straining - Prefers to avoid medication out of concern for side effects - Previous clinic note 08/2023 reports she previously trialed pelvic floor physical  therapy without success - Overall satisfied with current bowel regimen  - Experiences symptoms of excess gas in the morning that she associates with CPAP use as she is a mouth breather  - Colonoscopy 09/2023 -left-sided diverticula and a 5 mm hyperplastic polyp  Abdominal pain and gastrointestinal inflammation - Intermittent abdominal pain managed with sulindac and a heating pad - CT scan in November demonstrated inflammation around the colon - As above, colonoscopy 09/2023 was normal and did not show any evidence of IBD - Feels that symptoms tend to be related to diet and response to limited sugar and gluten - Sulindac helps with abdominal pain  Possible fatty liver and elevated alkaline phosphatase - Mildly elevated alkaline phosphatase levels monitored by primary care physician - Isoenzyme testing performed by functional medicine provider -liver 56, bone 43, intestine 1-all normal - RUQ ultrasound 08/2023 with subtle changes of liver echogenicity that could be consistent with fatty liver   GI Review of Symptoms Significant for intermittent reflux, abdominal pain and constipation. Otherwise negative.  General Review of Systems  Review of systems is significant for the pertinent positives and negatives as listed per the HPI.  Full ROS is otherwise negative.  Past Medical History   Past Medical History:  Diagnosis Date   Allergy    Anxiety    Arthritis    Rheumatoid    Bronchitis    Generalized headaches    GERD (gastroesophageal reflux disease)    Hashimoto's thyroiditis    Hyperlipidemia    Sleep apnea with use of continuous positive airway pressure (CPAP)    Thyroid  disease    Graves      Past Surgical History   Past Surgical History:  Procedure  Laterality Date   ABDOMINAL HYSTERECTOMY     partial for alleviating arthritis   CHOLECYSTECTOMY     COLONOSCOPY N/A 08/20/2014   Procedure: COLONOSCOPY;  Surgeon: [name], MD;  Location: [facility] ENDOSCOPY;   Service: Endoscopy;  Laterality: N/A;   KNEE ARTHROSCOPY Left    x 2   ROTATOR CUFF REPAIR Left    TONSILLECTOMY     TUBAL LIGATION       Allergies and Medications   Allergies  Allergen Reactions   Remicade [Infliximab] Hives   Elemental Sulfur Nausea Only   Sulfites Nausea And Vomiting   Tramadol Anxiety   Current Meds  Medication Sig   acetaminophen (TYLENOL) 650 MG CR tablet Take 1,300 mg by mouth every 8 (eight) hours as needed for pain.   albuterol  (PROVENTIL  HFA;VENTOLIN  HFA) 108 (90 Base) MCG/ACT inhaler Inhale 1-2 puffs into the lungs every 6 (six) hours as needed for wheezing or shortness of breath.   predniSONE  (DELTASONE ) 10 MG tablet SMARTSIG:- Tablet(s) By Mouth -   sulindac (CLINORIL) 200 MG tablet    SYNTHROID 175 MCG tablet Take 175 mcg by mouth daily.     Family His   Family History  Problem Relation Age of Onset   Rheum arthritis Mother        psoratic   Hypertension Mother    COPD Mother    Thyroid  disease Mother    Stroke Father    Thyroid  disease Father    Ovarian cancer Sister    Thyroid  disease Sister    Heart disease Maternal Grandmother    Rheum arthritis Paternal Grandmother    Diabetes Paternal Grandfather    Breast cancer Neg Hx    Social History   Social History   Tobacco Use   Smoking status: Heavy Smoker    Current packs/day: 1.00    Average packs/day: 1 pack/day for 20.0 years (20.0 ttl pk-yrs)    Types: Cigarettes   Smokeless tobacco: Never   Tobacco comments:    Patient is engaging in health coaching for smoking cessation.  Vaping Use   Vaping status: Never Used  Substance Use Topics   Alcohol use: No   Drug use: No   [name] reports that she has been smoking cigarettes. She has a 20 pack-year smoking history. She has never used smokeless tobacco. She reports that she does not drink alcohol and does not use drugs.  Vital Signs and Physical Examination   Vitals:   12/11/23 0824  BP: 122/70  Pulse: 73   Body mass  index is 27.66 kg/m. Weight: 150 lb (68 kg)  General: Well developed, well nourished, no acute distress Head: Normocephalic and atraumatic Eyes: Sclerae anicteric, EOMI Ears: Normal auditory acuity Mouth: No deformities or lesions noted Lungs: Clear throughout to auscultation Heart: Regular rate and rhythm; No murmurs, rubs or bruits Abdomen: Soft, non tender and non distended. No masses, hepatosplenomegaly or hernias noted. Normal Bowel sounds Rectal: Musculoskeletal: Symmetrical with no gross deformities  Pulses:  Normal pulses noted Extremities: No edema or deformities noted Neurological: Alert oriented x 4, grossly nonfocal Psychological:  Alert and cooperative. Normal mood and affect   Review of Data   The following data was reviewed at the time of this encounter:   Laboratory Studies      Latest Ref Rng & Units 08/19/2023    2:41 PM 02/28/2023    6:41 PM  CBC  WBC 4.0 - 10.5 K/uL 6.9  6.9   Hemoglobin 12.0 - 15.0  g/dL 86.1  87.8   Hematocrit 36.0 - 46.0 % 41.6  36.8   Platelets 150.0 - 400.0 K/uL 266.0  256     Lab Results  Component Value Date   LIPASE 42 02/28/2023      Latest Ref Rng & Units 08/19/2023    2:41 PM 02/28/2023    6:41 PM  CMP  Glucose 70 - 99 mg/dL 95  95   BUN 6 - 23 mg/dL 16  14   Creatinine 9.59 - 1.20 mg/dL 9.36  9.15   Sodium 864 - 145 mEq/L 136  137   Potassium 3.5 - 5.1 mEq/L 4.3  4.2   Chloride 96 - 112 mEq/L 102  105   CO2 19 - 32 mEq/L 26  23   Calcium 8.4 - 10.5 mg/dL 9.6  9.6   Total Protein 6.0 - 8.3 g/dL 7.2  7.1   Total Bilirubin 0.2 - 1.2 mg/dL 0.3  0.3   Alkaline Phos 39 - 117 U/L 151  152   AST 0 - 37 U/L 12  17   ALT 0 - 35 U/L 6  9    2021-H. pylori breath test negative   Imaging Studies  RUQ US  08/29/2023 1. No acute abnormality identified. 2. Mild increased echotexture of the liver. This is a nonspecific finding but can be seen in fatty infiltration of liver.  CTAP 02/28/2023  1. Subtle stranding along  the wall of the ascending colon in the right abdomen, cannot exclude low-grade proximal colitis. 2. Normal appendix. 3. Faint retroperitoneal stranding particularly in the periaortic region, query low-grade retroperitoneal fibrosis. 4. Suspected right foraminal impingement at L3-4 due to disc protrusion. 5. Mild intrahepatic biliary dilatation with the common bile duct about 0.8 cm in diameter. Prior cholecystectomy. The mild biliary dilatation may simply be a physiologic response to cholecystectomy, correlate with any significant elevation of liver enzyme levels or bilirubin levels. 6. Aortic atherosclerosis.  AUS 12/23/2019 Prior cholecystectomy. No evidence of biliary ductal dilatation or other acute findings.   Mild hepatic steatosis.  GI Procedures and Studies  EGD/Colonoscopy 09/30/2023 EGD - endoscopically normal, hiatal hernia Colonoscopy -left colon diverticula, 5 mm sigmoid colon polyp (HP), normal mucosa throughout the colon and TI, IH  Path:  Chronic inactive gastritis and chemical gastropathy without H. Pylori Normal duodenum and colon Hyperplastic polyp  Colonoscopy 08/20/2014 Normal colon and TI, internal hemorrhoids  Clinical Impression  It is my clinical impression that Ms. Jablon is a 53 y.o. female with;  GERD Constipation Increased liver echogenicity-possible fatty liver Status postcholecystectomy with minor biliary ductal dilation on CT 02/28/2023 Mildly elevated alkaline phosphatase-other LFTs normal  Wilkins returns to the office today to follow-up multiple gastrointestinal issues as outlined above.  She underwent EGD and colonoscopy 09/2023 which showed an endoscopically normal upper GI tract with a hiatal hernia.  Colonoscopy was overall normal with left colon diverticula and a 5 mm colon polyp.  Today, she reports that her symptoms are overall stable as long as she adheres to dietary modification and the use of probiotics.  She follows an autoimmune  protocol (AIP) diet and is careful to limit gluten, dairy and sugar.  She also uses a daily probiotic.  She feels that utilizing these interventions ameliorates both her GERD and constipation.  In addition to these measures she is also employing reflux Gourmet on a as needed basis which has had a positive impact on her reflux.  Previous CT raise the possibility of possible IBD due to  thickening in the ascending colon.  Her colon was endoscopically normal.  We reviewed at today's visit that there is no current evidence that she has IBD.  She takes sulindac for management of her rheumatoid arthritis which also helps with her abdominal pain.  She is monitored by her rheumatologist.  In terms of her hepatology issues, RUQ ultrasound showed echogenic changes in her liver that could be consistent with possible fatty liver.  Her liver enzymes are normal with the exception of persistently elevated alkaline phosphatase.  Her functional medicine provider performed alkaline phosphatase isoenzymes all of which were normal.  At today's visit we discussed repeating her liver function test and obtaining a GGT as well as vitamin D  level.    Plan  Labs today: Hepatic function panel, GGT, vitamin D  Monitor liver enzymes every 6 to 12 months Discussed repeat liver ultrasound every 2 to 3 years Continue dietary modification with AIP diet limiting gluten, dairy and sugar Continue reflux Gourmet Next screening colonoscopy due 09/19/1933  Planned Follow Up 6-12 months  The patient or caregiver verbalized understanding of the material covered, with no barriers to understanding. All questions were answered. Patient or caregiver is agreeable with the plan outlined above.    It was a pleasure to see Sheyli.  If you have any questions or concerns regarding this evaluation, do not hesitate to contact me.  Inocente Hausen, MD [facility]   I spent total of 30 minutes in both face-to-face (20  minutes interview)  and non-face-to-face (10 minutes chart review, care coordination, documentation)  activities, excluding procedures performed, for the visit on the date of this encounter.

## 2023-12-11 ENCOUNTER — Other Ambulatory Visit (INDEPENDENT_AMBULATORY_CARE_PROVIDER_SITE_OTHER)

## 2023-12-11 ENCOUNTER — Ambulatory Visit: Admitting: Pediatrics

## 2023-12-11 ENCOUNTER — Encounter: Payer: Self-pay | Admitting: Pediatrics

## 2023-12-11 VITALS — BP 122/70 | HR 73 | Ht 61.75 in | Wt 150.0 lb

## 2023-12-11 DIAGNOSIS — K219 Gastro-esophageal reflux disease without esophagitis: Secondary | ICD-10-CM

## 2023-12-11 DIAGNOSIS — K449 Diaphragmatic hernia without obstruction or gangrene: Secondary | ICD-10-CM

## 2023-12-11 DIAGNOSIS — K5900 Constipation, unspecified: Secondary | ICD-10-CM

## 2023-12-11 DIAGNOSIS — R748 Abnormal levels of other serum enzymes: Secondary | ICD-10-CM

## 2023-12-11 DIAGNOSIS — K760 Fatty (change of) liver, not elsewhere classified: Secondary | ICD-10-CM

## 2023-12-11 DIAGNOSIS — Z860100 Personal history of colon polyps, unspecified: Secondary | ICD-10-CM

## 2023-12-11 LAB — HEPATIC FUNCTION PANEL
ALT: 5 U/L (ref 0–35)
AST: 10 U/L (ref 0–37)
Albumin: 4.3 g/dL (ref 3.5–5.2)
Alkaline Phosphatase: 148 U/L — ABNORMAL HIGH (ref 39–117)
Bilirubin, Direct: 0 mg/dL (ref 0.0–0.3)
Total Bilirubin: 0.3 mg/dL (ref 0.2–1.2)
Total Protein: 7.1 g/dL (ref 6.0–8.3)

## 2023-12-11 LAB — GAMMA GT: GGT: 12 U/L (ref 7–51)

## 2023-12-11 LAB — VITAMIN D 25 HYDROXY (VIT D DEFICIENCY, FRACTURES): VITD: 39.49 ng/mL (ref 30.00–100.00)

## 2023-12-11 NOTE — Patient Instructions (Signed)
 Your provider has requested that you go to the basement level for lab work before leaving today. Press B on the elevator. The lab is located at the first door on the left as you exit the elevator.  Due to recent changes in healthcare laws, you may see the results of your imaging and laboratory studies on MyChart before your provider has had a chance to review them.  We understand that in some cases there may be results that are confusing or concerning to you. Not all laboratory results come back in the same time frame and the provider may be waiting for multiple results in order to interpret others.  Please give us  48 hours in order for your provider to thoroughly review all the results before contacting the office for clarification of your results.    Follow up in 1 year or earlier if needed.  Thank you for entrusting me with your care and for choosing [facility], Dr. Inocente Hausen  _______________________________________________________  If your blood pressure at your visit was 140/90 or greater, please contact your primary care physician to follow up on this.  _______________________________________________________  If you are age 62 or older, your body mass index should be between 23-30. Your Body mass index is 27.66 kg/m. If this is out of the aforementioned range listed, please consider follow up with your Primary Care Provider.  If you are age 53 or younger, your body mass index should be between 19-25. Your Body mass index is 27.66 kg/m. If this is out of the aformentioned range listed, please consider follow up with your Primary Care Provider.   ________________________________________________________  The [facility] providers would like to encourage you to use MYCHART to communicate with providers for non-urgent requests or questions.  Due to long hold times on the telephone, sending your provider a message by [facility] may be a faster and more efficient way to get a  response.  Please allow 48 business hours for a response.  Please remember that this is for non-urgent requests.  _______________________________________________________  [facility] is using a team-based approach to care.  Your team is made up of your doctor and two to three APPS. Our APPS (Nurse Practitioners and Physician Assistants) work with your physician to ensure care continuity for you. They are fully qualified to address your health concerns and develop a treatment plan. They communicate directly with your gastroenterologist to care for you. Seeing the Advanced Practice Practitioners on your physician's team can help you by facilitating care more promptly, often allowing for earlier appointments, access to diagnostic testing, procedures, and other specialty referrals.

## 2023-12-16 ENCOUNTER — Ambulatory Visit: Payer: Self-pay | Admitting: Pediatrics
